# Patient Record
Sex: Female | Born: 1990 | Race: White | Hispanic: No | Marital: Single | Smoking: Former smoker
Health system: Southern US, Community
[De-identification: ages and names within clinical notes are randomized; demographics above are authoritative.]

## PROBLEM LIST (undated history)

## (undated) DIAGNOSIS — O039 Complete or unspecified spontaneous abortion without complication: Secondary | ICD-10-CM

## (undated) DIAGNOSIS — F410 Panic disorder [episodic paroxysmal anxiety] without agoraphobia: Secondary | ICD-10-CM

## (undated) DIAGNOSIS — G43909 Migraine, unspecified, not intractable, without status migrainosus: Secondary | ICD-10-CM

## (undated) DIAGNOSIS — F32A Depression, unspecified: Secondary | ICD-10-CM

## (undated) DIAGNOSIS — F429 Obsessive-compulsive disorder, unspecified: Secondary | ICD-10-CM

## (undated) HISTORY — PX: DILATION AND CURETTAGE OF UTERUS: SHX78

## (undated) HISTORY — PX: INDUCED ABORTION: SHX677

## (undated) HISTORY — DX: Obsessive-compulsive disorder, unspecified: F42.9

## (undated) HISTORY — DX: Depression, unspecified: F32.A

## (undated) HISTORY — PX: APPENDECTOMY: SHX54

---
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
## 2011-06-04 ENCOUNTER — Emergency Department (HOSPITAL_COMMUNITY): Payer: No Typology Code available for payment source

## 2011-06-04 ENCOUNTER — Emergency Department (HOSPITAL_COMMUNITY)
Admission: EM | Admit: 2011-06-04 | Discharge: 2011-06-04 | Disposition: A | Discharge status: Home / Self Care | Payer: No Typology Code available for payment source | Attending: Emergency Medicine | Admitting: Emergency Medicine

## 2011-06-04 ENCOUNTER — Encounter (HOSPITAL_COMMUNITY): Payer: Self-pay | Admitting: *Deleted

## 2011-06-04 DIAGNOSIS — S6290XA Unspecified fracture of unspecified wrist and hand, initial encounter for closed fracture: Secondary | ICD-10-CM

## 2011-06-04 DIAGNOSIS — S62233A Other displaced fracture of base of first metacarpal bone, unspecified hand, initial encounter for closed fracture: Secondary | ICD-10-CM | POA: Insufficient documentation

## 2011-06-04 DIAGNOSIS — M542 Cervicalgia: Secondary | ICD-10-CM | POA: Insufficient documentation

## 2011-06-04 DIAGNOSIS — F17200 Nicotine dependence, unspecified, uncomplicated: Secondary | ICD-10-CM | POA: Insufficient documentation

## 2011-06-04 DIAGNOSIS — M79609 Pain in unspecified limb: Secondary | ICD-10-CM | POA: Insufficient documentation

## 2011-06-04 MED ORDER — SODIUM POLYSTYRENE SULFONATE 15 GM/60ML PO SUSP: 60.0000 g | Freq: Once | ORAL | Status: DC

## 2011-06-04 MED ORDER — HYDROCODONE-ACETAMINOPHEN 5-325 MG PO TABS: 1.0000 | ORAL_TABLET | Freq: Four times a day (QID) | ORAL | Status: AC | PRN
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
## 2011-06-04 NOTE — Discharge Instructions (Signed)
Follow up with dr. Hilda Lias next week for recheck

## 2011-06-04 NOTE — ED Notes (Signed)
Patient returned from  X-ray. Pt tolerated procedure well.
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
## 2011-07-20 ENCOUNTER — Encounter (HOSPITAL_COMMUNITY): Payer: Self-pay | Admitting: *Deleted

## 2011-07-20 ENCOUNTER — Emergency Department (HOSPITAL_COMMUNITY)
Admission: EM | Admit: 2011-07-20 | Discharge: 2011-07-20 | Disposition: A | Discharge status: Home / Self Care | Payer: Self-pay | Attending: Emergency Medicine | Admitting: Emergency Medicine

## 2011-07-20 DIAGNOSIS — F17200 Nicotine dependence, unspecified, uncomplicated: Secondary | ICD-10-CM | POA: Insufficient documentation

## 2011-07-20 DIAGNOSIS — L559 Sunburn, unspecified: Secondary | ICD-10-CM | POA: Insufficient documentation

## 2011-07-20 DIAGNOSIS — L550 Sunburn of first degree: Secondary | ICD-10-CM

## 2011-07-20 MED ORDER — PREDNISONE 20 MG PO TABS
60.0000 mg | ORAL_TABLET | Freq: Once | ORAL | Status: AC
  Administered 2011-07-20: 60 mg via ORAL
  Filled 2011-07-20: qty 3

## 2011-07-20 MED ORDER — HYDROXYZINE PAMOATE 25 MG PO CAPS: ORAL_CAPSULE | ORAL | Status: DC

## 2011-07-20 MED ORDER — HYDROXYZINE HCL 50 MG/ML IM SOLN: 25.0000 mg | Freq: Once | INTRAMUSCULAR | Status: DC

## 2011-07-20 MED ORDER — PREDNISONE 10 MG PO TABS: ORAL_TABLET | ORAL | Status: DC

## 2011-07-20 MED ORDER — HYDROXYZINE HCL 25 MG PO TABS
50.0000 mg | ORAL_TABLET | Freq: Once | ORAL | Status: AC
  Administered 2011-07-20: 50 mg via ORAL
  Filled 2011-07-20: qty 2
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
## 2011-07-20 NOTE — Discharge Instructions (Signed)
Sunburn  Sunburn is damage to the skin caused by overexposure to ultraviolet (UV) rays. People with light skin or a fair complexion may be more susceptible to sunburn. Repeated sun exposure causes early skin aging such as wrinkles and sun spots. It also increases the risk of skin cancer.  CAUSES  A sunburn is caused by getting too much UV radiation from the sun.  SYMPTOMS   Red or pink skin.   Soreness and swelling.   Pain.   Blisters.   Peeling skin.   Headache, fever, and fatigue if sunburn covers a large area.  TREATMENT   Your caregiver may tell you to take certain medicines to lessen inflammation.   Your caregiver may have you use hydrocortisone cream or spray to help with itching and inflammation.   Your caregiver may prescribe an antibiotic cream to use on blisters.  HOME CARE INSTRUCTIONS    Avoid further exposure to the sun.   Cool baths and cool compresses may be helpful if used several times per day. Do not apply ice, since this may result in more damage to the skin.   Only take over-the-counter or prescription medicines for pain, discomfort, or fever as directed by your caregiver.   Use aloe or other over-the-counter sunburn creams or gels on your skin. Do not apply these creams or gels on blisters.   Drink enough fluids to keep your urine clear or pale yellow.   Do not break blisters. If blisters break, your caregiver may recommend an antibiotic cream to apply to the affected area.  PREVENTION    Try to avoid the sun between 10:00 a.m. and 4:00 p.m. when it is the strongest.   Use a sunscreen or sunblock with SPF 30 or greater.   Apply sunscreen at least 30 minutes before exposure to the sun.   Always wear protective hats, clothing, and sunglasses with UV protection.   Avoid medicines, herbs, and foods that increase your sensitivity to sunlight.   Avoid tanning beds.  SEEK IMMEDIATE MEDICAL CARE IF:    You have a fever.   Your pain is uncontrolled with medicine.   You start to  vomit or have diarrhea.   You feel faint or develop a headache with confusion.   You develop severe blistering.   You have a pus-like (purulent) discharge coming from the blisters.   Your burn becomes more painful and swollen.  MAKE SURE YOU:   Understand these instructions.   Will watch your condition.   Will get help right away if you are not doing well or get worse.  Document Released: 10/16/2004 Document Revised: 12/26/2010 Document Reviewed: 06/30/2010  [employer] Patient Information 2012 [employer].
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
## 2011-07-20 NOTE — ED Provider Notes (Signed)
Medical screening examination/treatment/procedure(s) were performed by non-physician practitioner and as supervising physician I was immediately available for consultation/collaboration.   Caron Tardif L Taysha Majewski, MD 07/20/11 2225 
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
## 2011-10-06 ENCOUNTER — Encounter (HOSPITAL_COMMUNITY): Payer: Self-pay | Admitting: *Deleted

## 2011-10-06 ENCOUNTER — Emergency Department (HOSPITAL_COMMUNITY)
Admission: EM | Admit: 2011-10-06 | Discharge: 2011-10-06 | Disposition: A | Discharge status: Home / Self Care | Payer: Self-pay | Attending: Emergency Medicine | Admitting: Emergency Medicine

## 2011-10-06 DIAGNOSIS — F17200 Nicotine dependence, unspecified, uncomplicated: Secondary | ICD-10-CM | POA: Insufficient documentation

## 2011-10-06 DIAGNOSIS — J019 Acute sinusitis, unspecified: Secondary | ICD-10-CM | POA: Insufficient documentation

## 2011-10-06 DIAGNOSIS — R51 Headache: Secondary | ICD-10-CM | POA: Insufficient documentation

## 2011-10-06 HISTORY — DX: Migraine, unspecified, not intractable, without status migrainosus: G43.909

## 2011-10-06 MED ORDER — HYDROCODONE-ACETAMINOPHEN 5-325 MG PO TABS
1.0000 | ORAL_TABLET | Freq: Once | ORAL | Status: AC
  Administered 2011-10-06: 1 via ORAL
  Filled 2011-10-06: qty 1

## 2011-10-06 MED ORDER — AMOXICILLIN 500 MG PO CAPS: 500.0000 mg | ORAL_CAPSULE | Freq: Three times a day (TID) | ORAL | Status: DC

## 2011-10-06 MED ORDER — HYDROCODONE-ACETAMINOPHEN 5-325 MG PO TABS: 1.0000 | ORAL_TABLET | Freq: Four times a day (QID) | ORAL | Status: AC | PRN

## 2011-10-06 MED ORDER — AMOXICILLIN 250 MG PO CAPS
500.0000 mg | ORAL_CAPSULE | Freq: Once | ORAL | Status: AC
  Administered 2011-10-06: 500 mg via ORAL
  Filled 2011-10-06: qty 2

## 2011-10-06 NOTE — ED Notes (Addendum)
Headache, vomiting Hurts behind rt eye.  Was supposed to get her depo shot and missed the appt. LMP lasted 1.5 day.

## 2011-10-06 NOTE — ED Provider Notes (Signed)
History     CSN: [id number]  Arrival date & time 10/06/11  1820   First MD Initiated Contact with Patient 10/06/11 1946      Chief Complaint  Patient presents with  . Headache    (Consider location/radiation/quality/duration/timing/severity/associated sxs/prior treatment) HPI Comments: Pain over R frontal sinus.  Nausea and vomiting x 3 today.  "Fever of 100 . Something".  The history is provided by the patient. No language interpreter was used.    Past Medical History  Diagnosis Date  . Migraine     Past Surgical History  Procedure Date  . Induced abortion     History reviewed. No pertinent family history.  History  Substance Use Topics  . Smoking status: Current Every Day Smoker    Types: Cigarettes  . Smokeless tobacco: Not on file  . Alcohol Use: No    OB History    Grav Para Term Preterm Abortions TAB SAB Ect Mult Living                  Review of Systems  Constitutional: Positive for fever. Negative for chills.  HENT: Positive for sinus pressure.   Gastrointestinal: Positive for nausea and vomiting.  Neurological: Positive for headaches.  All other systems reviewed and are negative.    Allergies  Review of patient's allergies indicates no known allergies.  Home Medications   Current Outpatient Rx  Name Route Sig Dispense Refill  . AMOXICILLIN 500 MG PO CAPS Oral Take 1 capsule (500 mg total) by mouth 3 (three) times daily. 21 capsule 0  . HYDROCODONE-ACETAMINOPHEN 5-325 MG PO TABS Oral Take 1 tablet by mouth every 6 (six) hours as needed for pain. 12 tablet 0  . HYDROXYZINE PAMOATE 25 MG PO CAPS  1 po q6h for burning and discomfort. 20 capsule 0  . PREDNISONE 10 MG PO TABS  5,4,3,2,1 - take with food 15 tablet 0    BP 113/71  Pulse 92  Temp 98.3 F (36.8 C) (Oral)  Resp 20  Ht 5' (1.524 m)  Wt 115 lb (52.164 kg)  BMI 22.46 kg/m2  SpO2 100%  LMP 09/24/2011  Physical Exam  Nursing note and vitals reviewed. Constitutional: She is  oriented to person, place, and time. She appears well-developed and well-nourished. No distress.  HENT:  Head: Normocephalic and atraumatic.    Eyes: EOM are normal.  Neck: Normal range of motion.  Cardiovascular: Normal rate, regular rhythm and normal heart sounds.   Pulmonary/Chest: Effort normal and breath sounds normal.  Abdominal: Soft. She exhibits no distension. There is no tenderness.  Musculoskeletal: Normal range of motion.  Lymphadenopathy:    She has no cervical adenopathy.  Neurological: She is alert and oriented to person, place, and time.  Skin: Skin is warm and dry.  Psychiatric: She has a normal mood and affect. Judgment normal.    ED Course  Procedures (including critical care time)  Labs Reviewed - No data to display No results found.   1. Sinusitis acute   2. Sinus headache       MDM  rx-amoxicillin 500 mg TID, 21 rx-hydrocodone, 21  F/u [facility] prn        Evalina Field, [place] 10/06/11 2022

## 2011-10-06 NOTE — ED Provider Notes (Signed)
Medical screening examination/treatment/procedure(s) were performed by non-physician practitioner and as supervising physician I was immediately available for consultation/collaboration.  Juliet Rude. Rubin Payor, MD 10/06/11 [phone]

## 2012-02-29 ENCOUNTER — Encounter (HOSPITAL_COMMUNITY): Payer: Self-pay

## 2012-02-29 ENCOUNTER — Emergency Department (HOSPITAL_COMMUNITY): Payer: Medicaid Other

## 2012-02-29 ENCOUNTER — Emergency Department (HOSPITAL_COMMUNITY)
Admission: EM | Admit: 2012-02-29 | Discharge: 2012-02-29 | Disposition: A | Discharge status: Home / Self Care | Payer: Medicaid Other | Attending: Emergency Medicine | Admitting: Emergency Medicine

## 2012-02-29 DIAGNOSIS — S0993XA Unspecified injury of face, initial encounter: Secondary | ICD-10-CM | POA: Insufficient documentation

## 2012-02-29 DIAGNOSIS — R42 Dizziness and giddiness: Secondary | ICD-10-CM | POA: Insufficient documentation

## 2012-02-29 DIAGNOSIS — R1110 Vomiting, unspecified: Secondary | ICD-10-CM | POA: Insufficient documentation

## 2012-02-29 DIAGNOSIS — Z8679 Personal history of other diseases of the circulatory system: Secondary | ICD-10-CM | POA: Insufficient documentation

## 2012-02-29 DIAGNOSIS — F17200 Nicotine dependence, unspecified, uncomplicated: Secondary | ICD-10-CM | POA: Insufficient documentation

## 2012-02-29 DIAGNOSIS — S0990XA Unspecified injury of head, initial encounter: Secondary | ICD-10-CM

## 2012-02-29 DIAGNOSIS — H539 Unspecified visual disturbance: Secondary | ICD-10-CM | POA: Insufficient documentation

## 2012-02-29 MED ORDER — PROMETHAZINE HCL 25 MG PO TABS: 25.0000 mg | ORAL_TABLET | Freq: Four times a day (QID) | ORAL | Status: DC | PRN

## 2012-02-29 MED ORDER — HYDROCODONE-ACETAMINOPHEN 5-325 MG PO TABS: 1.0000 | ORAL_TABLET | Freq: Four times a day (QID) | ORAL | Status: DC | PRN
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
## 2012-03-25 ENCOUNTER — Emergency Department (HOSPITAL_COMMUNITY)
Admission: EM | Admit: 2012-03-25 | Discharge: 2012-03-25 | Disposition: A | Discharge status: Home / Self Care | Payer: Self-pay | Attending: Emergency Medicine | Admitting: Emergency Medicine

## 2012-03-25 ENCOUNTER — Encounter (HOSPITAL_COMMUNITY): Payer: Self-pay | Admitting: Emergency Medicine

## 2012-03-25 DIAGNOSIS — R109 Unspecified abdominal pain: Secondary | ICD-10-CM | POA: Insufficient documentation

## 2012-03-25 DIAGNOSIS — O9989 Other specified diseases and conditions complicating pregnancy, childbirth and the puerperium: Secondary | ICD-10-CM | POA: Insufficient documentation

## 2012-03-25 DIAGNOSIS — O99330 Smoking (tobacco) complicating pregnancy, unspecified trimester: Secondary | ICD-10-CM | POA: Insufficient documentation

## 2012-03-25 DIAGNOSIS — O26899 Other specified pregnancy related conditions, unspecified trimester: Secondary | ICD-10-CM

## 2012-03-25 DIAGNOSIS — Z8679 Personal history of other diseases of the circulatory system: Secondary | ICD-10-CM | POA: Insufficient documentation

## 2012-03-25 LAB — URINALYSIS, ROUTINE W REFLEX MICROSCOPIC
Leukocytes, UA: NEGATIVE
Nitrite: NEGATIVE
Specific Gravity, Urine: 1.015 (ref 1.005–1.030)
Urobilinogen, UA: 0.2 mg/dL (ref 0.0–1.0)
pH: 6 (ref 5.0–8.0)

## 2012-03-25 LAB — PREGNANCY, URINE: Preg Test, Ur: POSITIVE — AB

## 2012-03-25 LAB — WET PREP, GENITAL
Trich, Wet Prep: NONE SEEN
Yeast Wet Prep HPF POC: NONE SEEN

## 2012-03-25 NOTE — ED Provider Notes (Signed)
History     CSN: [id number]  Arrival date & time 03/25/12  1319   First MD Initiated Contact with Patient 03/25/12 1700      Chief Complaint  Patient presents with  . Abdominal Pain    (Consider location/radiation/quality/duration/timing/severity/associated sxs/prior treatment) Patient is a 22 y.o. female presenting with abdominal pain.  Abdominal Pain  Pt reports she is approx [redacted]weeks pregnant [facility] She has also had pelvic exam and STD screening done there recently and told she has a bacterial infection which they are monitoring but not treating. The patient reports today she had moderate sharp lower abdominal pain, but no vaginal discharge or bleeding.   Past Medical History  Diagnosis Date  . Migraine     Past Surgical History  Procedure Laterality Date  . Induced abortion      Family History  Problem Relation Age of Onset  . Hypertension Mother   . Cancer Other   . Hypertension Other     History  Substance Use Topics  . Smoking status: Current Every Day Smoker    Types: Cigarettes  . Smokeless tobacco: Not on file  . Alcohol Use: No    OB History   Grav Para Term Preterm Abortions TAB SAB Ect Mult Living   1               Review of Systems  Gastrointestinal: Positive for abdominal pain.   All other systems reviewed and are negative except as noted in HPI.   Allergies  Erythromycin  Home Medications   Current Outpatient Rx  Name  Route  Sig  Dispense  Refill  . HYDROcodone-acetaminophen (NORCO/VICODIN) 5-325 MG per tablet   Oral   Take 1 tablet by mouth every 6 (six) hours as needed for pain.   15 tablet   0   . promethazine (PHENERGAN) 25 MG tablet   Oral   Take 1 tablet (25 mg total) by mouth every 6 (six) hours as needed for nausea.   15 tablet   0     BP 106/63  Pulse 83  Temp(Src) 97.9 F (36.6 C) (Oral)  Resp 18  Ht 5' (1.524 m)  Wt 120 lb (54.432 kg)  BMI 23.44 kg/m2  SpO2 100%   LMP 02/21/2012  Physical Exam  Nursing note and vitals reviewed. Constitutional: She is oriented to person, place, and time. She appears well-developed and well-nourished.  HENT:  Head: Normocephalic and atraumatic.  Eyes: EOM are normal. Pupils are equal, round, and reactive to light.  Neck: Normal range of motion. Neck supple.  Cardiovascular: Normal rate, normal heart sounds and intact distal pulses.   Pulmonary/Chest: Effort normal and breath sounds normal.  Abdominal: Bowel sounds are normal. She exhibits no distension. There is no tenderness.  Genitourinary: Cervix exhibits no motion tenderness, no discharge and no friability. Right adnexum displays no mass and no tenderness. Left adnexum displays no mass. No bleeding around the vagina. No foreign body around the vagina. Vaginal discharge found.  Os closed, no POC  Musculoskeletal: Normal range of motion. She exhibits no edema and no tenderness.  Neurological: She is alert and oriented to person, place, and time. She has normal strength. No cranial nerve deficit or sensory deficit.  Skin: Skin is warm and dry. No rash noted.  Psychiatric: She has a normal mood and affect.    ED Course  Procedures (including critical care time)  Labs Reviewed  WET PREP, GENITAL -  Abnormal; Notable for the following:    Clue Cells Wet Prep HPF POC FEW (*)    WBC, Wet Prep HPF POC FEW (*)    All other components within normal limits  PREGNANCY, URINE - Abnormal; Notable for the following:    Preg Test, Ur POSITIVE (*)    All other components within normal limits  GC/CHLAMYDIA PROBE AMP  URINALYSIS, ROUTINE W REFLEX MICROSCOPIC   No results found.   No diagnosis found.    MDM  Pt requesting pelvic exam to 'make sure the cervix is closed'. Will check UA, wet prep, GC/C, but otherwise she has already had thorough evaluation for early pregnancy and does not need any further ED lab studies.   UA neg, wet prep unimpressive. Advised  [facility].       Charles B. Bernette Mayers, MD 03/25/12 1800

## 2012-03-25 NOTE — ED Notes (Signed)
Pt c/o low back, groin and abd pain, onset this am. Pt [redacted] weeks pregnant.

## 2012-05-11 ENCOUNTER — Emergency Department (HOSPITAL_COMMUNITY): Payer: Medicaid Other

## 2012-05-11 ENCOUNTER — Emergency Department (HOSPITAL_COMMUNITY)
Admission: EM | Admit: 2012-05-11 | Discharge: 2012-05-12 | Disposition: A | Discharge status: Home / Self Care | Payer: Medicaid Other | Attending: Emergency Medicine | Admitting: Emergency Medicine

## 2012-05-11 ENCOUNTER — Encounter (HOSPITAL_COMMUNITY): Payer: Self-pay | Admitting: *Deleted

## 2012-05-11 DIAGNOSIS — R091 Pleurisy: Secondary | ICD-10-CM | POA: Insufficient documentation

## 2012-05-11 DIAGNOSIS — R0789 Other chest pain: Secondary | ICD-10-CM

## 2012-05-11 DIAGNOSIS — R071 Chest pain on breathing: Secondary | ICD-10-CM | POA: Insufficient documentation

## 2012-05-11 DIAGNOSIS — Z8679 Personal history of other diseases of the circulatory system: Secondary | ICD-10-CM | POA: Insufficient documentation

## 2012-05-11 DIAGNOSIS — F17200 Nicotine dependence, unspecified, uncomplicated: Secondary | ICD-10-CM | POA: Insufficient documentation

## 2012-05-11 DIAGNOSIS — Z3201 Encounter for pregnancy test, result positive: Secondary | ICD-10-CM | POA: Insufficient documentation

## 2012-05-11 MED ORDER — IBUPROFEN 400 MG PO TABS
600.0000 mg | ORAL_TABLET | Freq: Once | ORAL | Status: AC
  Administered 2012-05-12: 600 mg via ORAL
  Filled 2012-05-11: qty 2
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
## 2012-05-11 NOTE — ED Notes (Signed)
Pt with left sided chest with movement and cough, family hx of cardiac problems, NP cough, smoker, denies SOB

## 2012-05-11 NOTE — ED Notes (Signed)
Xray called and needed urine preg prior to xray. Order placed and urine positive. Pt reports having D&C done on April 9th for miscarriage. Dr. Judd Lien aware of positive test.

## 2012-05-12 LAB — POCT PREGNANCY, URINE: Preg Test, Ur: POSITIVE — AB

## 2012-05-12 LAB — HCG, QUANTITATIVE, PREGNANCY: hCG, Beta Chain, Quant, S: 2 m[IU]/mL (ref <5)

## 2012-05-12 MED ORDER — IBUPROFEN 600 MG PO TABS: 600.0000 mg | ORAL_TABLET | Freq: Four times a day (QID) | ORAL | Status: DC | PRN

## 2012-06-01 ENCOUNTER — Encounter (HOSPITAL_COMMUNITY): Payer: Self-pay | Admitting: Emergency Medicine

## 2012-06-01 ENCOUNTER — Emergency Department (HOSPITAL_COMMUNITY)
Admission: EM | Admit: 2012-06-01 | Discharge: 2012-06-01 | Disposition: A | Discharge status: Home / Self Care | Payer: Medicaid Other | Attending: Emergency Medicine | Admitting: Emergency Medicine

## 2012-06-01 DIAGNOSIS — F17200 Nicotine dependence, unspecified, uncomplicated: Secondary | ICD-10-CM | POA: Insufficient documentation

## 2012-06-01 DIAGNOSIS — R059 Cough, unspecified: Secondary | ICD-10-CM | POA: Insufficient documentation

## 2012-06-01 DIAGNOSIS — J029 Acute pharyngitis, unspecified: Secondary | ICD-10-CM

## 2012-06-01 DIAGNOSIS — R05 Cough: Secondary | ICD-10-CM | POA: Insufficient documentation

## 2012-06-01 DIAGNOSIS — R110 Nausea: Secondary | ICD-10-CM

## 2012-06-01 DIAGNOSIS — G8929 Other chronic pain: Secondary | ICD-10-CM

## 2012-06-01 DIAGNOSIS — J3489 Other specified disorders of nose and nasal sinuses: Secondary | ICD-10-CM | POA: Insufficient documentation

## 2012-06-01 DIAGNOSIS — M79609 Pain in unspecified limb: Secondary | ICD-10-CM | POA: Insufficient documentation

## 2012-06-01 DIAGNOSIS — Z8659 Personal history of other mental and behavioral disorders: Secondary | ICD-10-CM | POA: Insufficient documentation

## 2012-06-01 DIAGNOSIS — Z8679 Personal history of other diseases of the circulatory system: Secondary | ICD-10-CM | POA: Insufficient documentation

## 2012-06-01 DIAGNOSIS — R112 Nausea with vomiting, unspecified: Secondary | ICD-10-CM | POA: Insufficient documentation

## 2012-06-01 HISTORY — DX: Panic disorder (episodic paroxysmal anxiety): F41.0

## 2012-06-01 HISTORY — DX: Complete or unspecified spontaneous abortion without complication: O03.9

## 2012-06-01 LAB — URINE MICROSCOPIC-ADD ON

## 2012-06-01 LAB — URINALYSIS, ROUTINE W REFLEX MICROSCOPIC
Bilirubin Urine: NEGATIVE
Glucose, UA: NEGATIVE mg/dL
Hgb urine dipstick: NEGATIVE
Specific Gravity, Urine: 1.015 (ref 1.005–1.030)
Urobilinogen, UA: 0.2 mg/dL (ref 0.0–1.0)

## 2012-06-01 MED ORDER — HYDROCODONE-ACETAMINOPHEN 5-325 MG PO TABS: ORAL_TABLET | ORAL | Status: DC

## 2012-06-01 MED ORDER — ONDANSETRON HCL 4 MG PO TABS: 4.0000 mg | ORAL_TABLET | Freq: Four times a day (QID) | ORAL | Status: DC

## 2012-06-01 MED ORDER — ONDANSETRON 8 MG PO TBDP
8.0000 mg | ORAL_TABLET | Freq: Once | ORAL | Status: AC
  Administered 2012-06-01: 8 mg via ORAL
  Filled 2012-06-01: qty 1

## 2012-06-01 NOTE — ED Notes (Signed)
Patient c/o itermittent nausea x2 weeks with vomiting last night. Denies any diarrhea or abd pain. Patient also reports sore throat and dry cough as well. Denies any fevers. Patient also c/o right hand pain and swelling. Per patient broke hand 2 months ago and was unable to follow-up with specialist.

## 2012-06-01 NOTE — ED Provider Notes (Signed)
History     CSN: [id number]  Arrival date & time 06/01/12  0903   First MD Initiated Contact with Patient 06/01/12 [phone]      Chief Complaint  Patient presents with  . Hand Pain  . Emesis  . Sore Throat    (Consider location/radiation/quality/duration/timing/severity/associated sxs/prior treatment) HPI Comments: Patient c/o nausea for 2 weeks with vomiting last night.  States that she is tolerating fluids and denies abdominal pain.  She also c/o sore throat and occasional cough that is non-productive.  She denies fever.  She states that she had a D&C one month ago and is unsure if she may be pregnant again.  States that a home pregnancy test was positive.  She denies vaginal bleeding or discharge.    Patient also c/o pain and swelling to her right thumb for 2 months.  States that she had a fracture to her thumb  "a while back" and did not f/u with an orthopedic doctor .  C/o pain to the area since.    Patient is a 22 y.o. female presenting with vomiting. The history is provided by the patient.  Emesis Severity:  Mild Duration:  2 weeks Timing:  Intermittent Quality:  Stomach contents Able to tolerate:  Liquids Progression:  Unchanged Chronicity:  Recurrent Recent urination:  Normal Relieved by:  Nothing Worsened by:  Nothing tried Ineffective treatments:  None tried Associated symptoms: sore throat   Associated symptoms: no abdominal pain, no chills, no cough, no diarrhea, no fever, no headaches, no myalgias and no URI   Sore throat:    Severity:  Mild   Onset quality:  Gradual   Duration:  2 days   Timing:  Constant   Progression:  Unchanged Risk factors: no diabetes, not pregnant now and no sick contacts     Past Medical History  Diagnosis Date  . Migraine   . Miscarriage   . Panic attack     Past Surgical History  Procedure Laterality Date  . Induced abortion    . Dilation and curettage of uterus      Family History  Problem Relation Age of Onset  .  Hypertension Mother   . Cancer Other   . Hypertension Other     History  Substance Use Topics  . Smoking status: Current Every Day Smoker -- 1.00 packs/day for 7 years    Types: Cigarettes  . Smokeless tobacco: Never Used  . Alcohol Use: No    OB History   Grav Para Term Preterm Abortions TAB SAB Ect Mult Living   1               Review of Systems  Constitutional: Negative for fever, chills, activity change and appetite change.  HENT: Positive for congestion and sore throat. Negative for trouble swallowing.   Respiratory: Positive for cough. Negative for chest tightness, shortness of breath and wheezing.   Cardiovascular: Negative for chest pain.  Gastrointestinal: Positive for nausea and vomiting. Negative for abdominal pain, diarrhea and abdominal distention.  Genitourinary: Negative for dysuria, frequency, vaginal bleeding, vaginal discharge and menstrual problem.  Musculoskeletal: Negative for myalgias.  Neurological: Negative for dizziness and headaches.  All other systems reviewed and are negative.    Allergies  Erythromycin  Home Medications   Current Outpatient Rx  Name  Route  Sig  Dispense  Refill  . HYDROcodone-acetaminophen (NORCO/VICODIN) 5-325 MG per tablet      Take one-two tabs po q 4-6 hrs prn pain   12  tablet   0   . ondansetron (ZOFRAN) 4 MG tablet   Oral   Take 1 tablet (4 mg total) by mouth every 6 (six) hours.   12 tablet   0     BP 105/70  Pulse 80  Temp(Src) 98 F (36.7 C) (Oral)  Resp 16  Ht 5' (1.524 m)  Wt 120 lb (54.432 kg)  BMI 23.44 kg/m2  SpO2 99%  LMP 02/21/2012  Physical Exam  Nursing note and vitals reviewed. Constitutional: She is oriented to person, place, and time. She appears well-developed and well-nourished. No distress.  HENT:  Head: Normocephalic and atraumatic.  Right Ear: Tympanic membrane and ear canal normal.  Left Ear: Tympanic membrane and ear canal normal.  Mouth/Throat: Uvula is midline and mucous  membranes are normal. Posterior oropharyngeal erythema present. No oropharyngeal exudate, posterior oropharyngeal edema or tonsillar abscesses.  Neck: Normal range of motion. Neck supple.  Cardiovascular: Normal rate, regular rhythm and normal heart sounds.   No murmur heard. Pulmonary/Chest: Effort normal and breath sounds normal. No respiratory distress. She exhibits no tenderness.  Abdominal: Soft. Bowel sounds are normal. She exhibits no distension and no mass. There is no tenderness. There is no rebound and no guarding.  Musculoskeletal: Normal range of motion. She exhibits tenderness.  ttp of the right proximal thumb, mild STS.  Radial pulse brisk. Distal sensation intact. Pt has full ROM of the thumb.  Lymphadenopathy:    She has no cervical adenopathy.  Neurological: She is alert and oriented to person, place, and time. She exhibits normal muscle tone. Coordination normal.  Skin: Skin is dry.    ED Course  Procedures (including critical care time)  Results for orders placed during the hospital encounter of 06/01/12  HCG, QUANTITATIVE, PREGNANCY      Result Value Range   hCG, Beta Chain, Quant, S <1  <5 mIU/mL  URINALYSIS, ROUTINE W REFLEX MICROSCOPIC      Result Value Range   Color, Urine YELLOW  YELLOW   APPearance CLEAR  CLEAR   Specific Gravity, Urine 1.015  1.005 - 1.030   pH 6.0  5.0 - 8.0   Glucose, UA NEGATIVE  NEGATIVE mg/dL   Hgb urine dipstick NEGATIVE  NEGATIVE   Bilirubin Urine NEGATIVE  NEGATIVE   Ketones, ur NEGATIVE  NEGATIVE mg/dL   Protein, ur NEGATIVE  NEGATIVE mg/dL   Urobilinogen, UA 0.2  0.0 - 1.0 mg/dL   Nitrite NEGATIVE  NEGATIVE   Leukocytes, UA TRACE (*) NEGATIVE  URINE MICROSCOPIC-ADD ON      Result Value Range   Squamous Epithelial / LPF FEW (*) RARE   WBC, UA 0-2  <3 WBC/hpf   RBC / HPF 0-2  <3 RBC/hpf   Bacteria, UA RARE  RARE      1. Chronic thumb pain, right   2. Nausea   3. Pharyngitis     velcro thumb spica applied.  Pain  improved, remains NV intact  MDM    U/A negative, beta HCG was 0.506.  Pt is well appearing, abd is NT.  VSS.  Patient agrees to clear fluids, rest and close f/u with her PMD .  Given referral for orthopedics.    The patient appears reasonably screened and/or stabilized for discharge and I doubt any other medical condition or other [facility] requiring further screening, evaluation, or treatment in the ED at this time prior to discharge.       Sriram Febles L. Trisha Mangle, PA-C 06/02/12 1942

## 2012-06-01 NOTE — ED Notes (Signed)
Tammy Triplett at bedside. 

## 2012-06-04 NOTE — ED Provider Notes (Signed)
Medical screening examination/treatment/procedure(s) were performed by non-physician practitioner and as supervising physician I was immediately available for consultation/collaboration. Devoria Albe, MD, FACEP   Ward Givens, MD 06/04/12 [phone]

## 2012-07-24 ENCOUNTER — Encounter (HOSPITAL_COMMUNITY): Payer: Self-pay | Admitting: Emergency Medicine

## 2012-07-24 ENCOUNTER — Emergency Department (HOSPITAL_COMMUNITY)
Admission: EM | Admit: 2012-07-24 | Discharge: 2012-07-25 | Disposition: A | Discharge status: Home / Self Care | Payer: Medicaid Other | Attending: Emergency Medicine | Admitting: Emergency Medicine

## 2012-07-24 DIAGNOSIS — Z8679 Personal history of other diseases of the circulatory system: Secondary | ICD-10-CM | POA: Insufficient documentation

## 2012-07-24 DIAGNOSIS — W230XXA Caught, crushed, jammed, or pinched between moving objects, initial encounter: Secondary | ICD-10-CM | POA: Insufficient documentation

## 2012-07-24 DIAGNOSIS — Y939 Activity, unspecified: Secondary | ICD-10-CM | POA: Insufficient documentation

## 2012-07-24 DIAGNOSIS — Z8659 Personal history of other mental and behavioral disorders: Secondary | ICD-10-CM | POA: Insufficient documentation

## 2012-07-24 DIAGNOSIS — S6000XA Contusion of unspecified finger without damage to nail, initial encounter: Secondary | ICD-10-CM | POA: Insufficient documentation

## 2012-07-24 DIAGNOSIS — Y929 Unspecified place or not applicable: Secondary | ICD-10-CM | POA: Insufficient documentation

## 2012-07-24 DIAGNOSIS — Z8781 Personal history of (healed) traumatic fracture: Secondary | ICD-10-CM | POA: Insufficient documentation

## 2012-07-24 DIAGNOSIS — M79641 Pain in right hand: Secondary | ICD-10-CM

## 2012-07-24 DIAGNOSIS — Z8742 Personal history of other diseases of the female genital tract: Secondary | ICD-10-CM | POA: Insufficient documentation

## 2012-07-24 DIAGNOSIS — F17200 Nicotine dependence, unspecified, uncomplicated: Secondary | ICD-10-CM | POA: Insufficient documentation
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
## 2012-07-25 ENCOUNTER — Emergency Department (HOSPITAL_COMMUNITY): Payer: Medicaid Other
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
## 2012-12-01 ENCOUNTER — Encounter (HOSPITAL_COMMUNITY): Payer: Self-pay | Admitting: Emergency Medicine

## 2012-12-01 ENCOUNTER — Emergency Department (HOSPITAL_COMMUNITY)
Admission: EM | Admit: 2012-12-01 | Discharge: 2012-12-01 | Disposition: A | Discharge status: Home / Self Care | Payer: Medicaid Other | Attending: Emergency Medicine | Admitting: Emergency Medicine

## 2012-12-01 DIAGNOSIS — Z8659 Personal history of other mental and behavioral disorders: Secondary | ICD-10-CM | POA: Insufficient documentation

## 2012-12-01 DIAGNOSIS — F17200 Nicotine dependence, unspecified, uncomplicated: Secondary | ICD-10-CM | POA: Insufficient documentation

## 2012-12-01 DIAGNOSIS — Z8742 Personal history of other diseases of the female genital tract: Secondary | ICD-10-CM | POA: Insufficient documentation

## 2012-12-01 DIAGNOSIS — B86 Scabies: Secondary | ICD-10-CM | POA: Insufficient documentation

## 2012-12-01 DIAGNOSIS — Z8679 Personal history of other diseases of the circulatory system: Secondary | ICD-10-CM | POA: Insufficient documentation

## 2012-12-01 MED ORDER — PERMETHRIN 5 % EX CREA: TOPICAL_CREAM | CUTANEOUS | Status: DC

## 2012-12-01 MED ORDER — HYDROXYZINE HCL 25 MG PO TABS: 25.0000 mg | ORAL_TABLET | Freq: Four times a day (QID) | ORAL | Status: DC
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
## 2012-12-03 NOTE — ED Provider Notes (Signed)
CSN: [id number]     Arrival date & time 12/01/12  2037 History   First MD Initiated Contact with Patient 12/01/12 2049     Chief Complaint  Patient presents with  . Rash   (Consider location/radiation/quality/duration/timing/severity/associated sxs/prior Treatment) Patient is a 22 y.o. female presenting with rash. The history is provided by the patient.  Rash Location:  Full body Quality: itchiness and redness   Quality: not blistering, not dry and not swelling   Severity:  Moderate Onset quality:  Gradual Duration:  4 weeks Timing:  Constant Progression:  Worsening Chronicity:  New Context: exposure to similar rash   Relieved by:  Nothing Worsened by:  Nothing tried Ineffective treatments: Anti-H. cream and Bactrim. Associated symptoms: no abdominal pain, no fever, no headaches, no induration, no joint pain, no myalgias, no nausea, no shortness of breath, no sore throat, no throat swelling, no tongue swelling, no URI, not vomiting and not wheezing    Patient reports persistent rash to most of her body for 4 weeks. She states the rash began after exposure to her significant other who also has a similar rash. She states that she was seen at another facility and treated with Bactrim without relief. She complains of significant itching all over. She denies fever, chills, vomiting, sore throat or neck pain. Past Medical History  Diagnosis Date  . Migraine   . Miscarriage   . Panic attack    Past Surgical History  Procedure Laterality Date  . Induced abortion    . Dilation and curettage of uterus     Family History  Problem Relation Age of Onset  . Hypertension Mother   . Cancer Other   . Hypertension Other    History  Substance Use Topics  . Smoking status: Current Every Day Smoker -- 1.00 packs/day for 7 years    Types: Cigarettes  . Smokeless tobacco: Never Used  . Alcohol Use: Yes     Comment: occasionally   OB History   Grav Para Term Preterm Abortions TAB SAB Ect  Mult Living   1              Review of Systems  Constitutional: Negative for fever, chills, activity change and appetite change.  HENT: Negative for facial swelling, sore throat and trouble swallowing.   Respiratory: Negative for chest tightness, shortness of breath and wheezing.   Gastrointestinal: Negative for nausea, vomiting and abdominal pain.  Musculoskeletal: Negative for arthralgias, myalgias, neck pain and neck stiffness.  Skin: Positive for rash. Negative for wound.  Neurological: Negative for dizziness, weakness, numbness and headaches.  All other systems reviewed and are negative.    Allergies  Erythromycin  Home Medications   Current Outpatient Rx  Name  Route  Sig  Dispense  Refill  . aspirin 325 MG tablet   Oral   Take 650 mg by mouth 2 (two) times daily as needed for mild pain, moderate pain or headache.         . hydrOXYzine (ATARAX/VISTARIL) 25 MG tablet   Oral   Take 1 tablet (25 mg total) by mouth every 6 (six) hours. As needed for itching.  May cause drowsiness   12 tablet   0   . permethrin (ELIMITE) 5 % cream      Apply to the entire body, leave on for 8-10 hrs then wash off.  May re-apply in one week   60 g   0    BP 120/53  Pulse 75  Temp(Src)  98.3 F (36.8 C) (Oral)  Resp 24  Ht 5' (1.524 m)  Wt 120 lb (54.432 kg)  BMI 23.44 kg/m2  SpO2 100%  LMP 11/28/2012 Physical Exam  Nursing note and vitals reviewed. Constitutional: She is oriented to person, place, and time. She appears well-developed and well-nourished. No distress.  HENT:  Head: Normocephalic and atraumatic.  Mouth/Throat: Oropharynx is clear and moist.  Neck: Normal range of motion. Neck supple.  Cardiovascular: Normal rate, regular rhythm, normal heart sounds and intact distal pulses.   No murmur heard. Pulmonary/Chest: Effort normal and breath sounds normal. No respiratory distress.  Musculoskeletal: She exhibits no edema and no tenderness.  Lymphadenopathy:    She  has no cervical adenopathy.  Neurological: She is alert and oriented to person, place, and time. She exhibits normal muscle tone. Coordination normal.  Skin: Skin is warm. Rash noted. There is erythema.  Diffuse, multiple, small erythematous papules to most of the body including with spaces of the fingers, trunk, and inguinal area.  No edema, pustules, or vesicles.  Multiple areas of excoriation also present.    ED Course  Procedures (including critical care time) Labs Review Labs Reviewed - No data to display Imaging Review No results found.  EKG Interpretation   None       MDM   1. Scabies    Rash appears consistent with scabies. I will treat with permethrin cream and hydroxyzine for itching. Have advised patient to followup with dermatology and given a referral for Dr. Margo Aye if the symptoms are not improving. Patient is well-appearing, vital signs are stable. She appears stable for discharge.    Carlton Sweaney L. Trisha Mangle, PA-C 12/03/12 0117

## 2012-12-03 NOTE — ED Provider Notes (Signed)
Medical screening examination/treatment/procedure(s) were performed by non-physician practitioner and as supervising physician I was immediately available for consultation/collaboration.  EKG Interpretation   None         Eleny Cortez, MD 12/03/12 2119 

## 2012-12-22 ENCOUNTER — Emergency Department (HOSPITAL_COMMUNITY)
Admission: EM | Admit: 2012-12-22 | Discharge: 2012-12-22 | Disposition: A | Discharge status: Home / Self Care | Payer: Medicaid Other | Attending: Emergency Medicine | Admitting: Emergency Medicine

## 2012-12-22 ENCOUNTER — Encounter (HOSPITAL_COMMUNITY): Payer: Self-pay | Admitting: Emergency Medicine

## 2012-12-22 DIAGNOSIS — Z8679 Personal history of other diseases of the circulatory system: Secondary | ICD-10-CM | POA: Insufficient documentation

## 2012-12-22 DIAGNOSIS — B86 Scabies: Secondary | ICD-10-CM | POA: Insufficient documentation

## 2012-12-22 DIAGNOSIS — F410 Panic disorder [episodic paroxysmal anxiety] without agoraphobia: Secondary | ICD-10-CM | POA: Insufficient documentation

## 2012-12-22 DIAGNOSIS — F17200 Nicotine dependence, unspecified, uncomplicated: Secondary | ICD-10-CM | POA: Insufficient documentation

## 2012-12-22 DIAGNOSIS — Z3202 Encounter for pregnancy test, result negative: Secondary | ICD-10-CM | POA: Insufficient documentation

## 2012-12-22 LAB — POCT PREGNANCY, URINE: Preg Test, Ur: NEGATIVE

## 2012-12-22 MED ORDER — PERMETHRIN 5 % EX CREA: TOPICAL_CREAM | CUTANEOUS | Status: DC

## 2012-12-22 NOTE — ED Notes (Signed)
Pt states she's had rash throughout entire body for over a month. Pt states she was treated with "a cream" but denies any relief of symptoms.
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
## 2013-04-22 ENCOUNTER — Encounter (HOSPITAL_COMMUNITY): Payer: Self-pay | Admitting: Emergency Medicine

## 2013-04-22 ENCOUNTER — Emergency Department (HOSPITAL_COMMUNITY)
Admission: EM | Admit: 2013-04-22 | Discharge: 2013-04-22 | Disposition: A | Discharge status: Home / Self Care | Payer: Medicaid Other | Attending: Emergency Medicine | Admitting: Emergency Medicine

## 2013-04-22 DIAGNOSIS — S3981XA Other specified injuries of abdomen, initial encounter: Secondary | ICD-10-CM | POA: Insufficient documentation

## 2013-04-22 DIAGNOSIS — J029 Acute pharyngitis, unspecified: Secondary | ICD-10-CM | POA: Insufficient documentation

## 2013-04-22 DIAGNOSIS — Z79899 Other long term (current) drug therapy: Secondary | ICD-10-CM | POA: Insufficient documentation

## 2013-04-22 DIAGNOSIS — Z8679 Personal history of other diseases of the circulatory system: Secondary | ICD-10-CM | POA: Insufficient documentation

## 2013-04-22 DIAGNOSIS — O9989 Other specified diseases and conditions complicating pregnancy, childbirth and the puerperium: Secondary | ICD-10-CM | POA: Insufficient documentation

## 2013-04-22 DIAGNOSIS — R1030 Lower abdominal pain, unspecified: Secondary | ICD-10-CM

## 2013-04-22 DIAGNOSIS — Z8659 Personal history of other mental and behavioral disorders: Secondary | ICD-10-CM | POA: Insufficient documentation

## 2013-04-22 DIAGNOSIS — O26899 Other specified pregnancy related conditions, unspecified trimester: Secondary | ICD-10-CM

## 2013-04-22 DIAGNOSIS — O99330 Smoking (tobacco) complicating pregnancy, unspecified trimester: Secondary | ICD-10-CM | POA: Insufficient documentation

## 2013-04-22 DIAGNOSIS — S0993XA Unspecified injury of face, initial encounter: Secondary | ICD-10-CM | POA: Insufficient documentation

## 2013-04-22 DIAGNOSIS — S199XXA Unspecified injury of neck, initial encounter: Secondary | ICD-10-CM

## 2013-04-22 LAB — URINALYSIS, ROUTINE W REFLEX MICROSCOPIC
Bilirubin Urine: NEGATIVE
GLUCOSE, UA: NEGATIVE mg/dL
HGB URINE DIPSTICK: NEGATIVE
Ketones, ur: NEGATIVE mg/dL
Leukocytes, UA: NEGATIVE
Nitrite: NEGATIVE
Protein, ur: NEGATIVE mg/dL
SPECIFIC GRAVITY, URINE: 1.025 (ref 1.005–1.030)
Urobilinogen, UA: 0.2 mg/dL (ref 0.0–1.0)
pH: 6 (ref 5.0–8.0)

## 2013-04-22 NOTE — Discharge Instructions (Signed)
Abdominal Pain During Pregnancy Belly (abdominal) pain is common during pregnancy. Most of the time, it is not a serious problem. Other times, it can be a sign that something is wrong with the pregnancy. Always tell your doctor if you have belly pain. HOME CARE Monitor your belly pain for any changes. The following actions may help you feel better:  Do not have sex (intercourse) or put anything in your vagina until you feel better.  Rest until your pain stops.  Drink clear fluids if you feel sick to your stomach (nauseous). Do not eat solid food until you feel better.  Only take medicine as told by your doctor.  Keep all doctor visits as told. GET HELP RIGHT AWAY IF:   You are bleeding, leaking fluid, or pieces of tissue come out of your vagina.  You have more pain or cramping.  You keep throwing up (vomiting).  You have pain when you pee (urinate) or have blood in your pee.  You have a fever.  You do not feel your baby moving as much.  You feel very weak or feel like passing out.  You have trouble breathing, with or without belly pain.  You have a very bad headache and belly pain.  You have fluid leaking from your vagina and belly pain.  You keep having watery poop (diarrhea).  Your belly pain does not go away after resting, or the pain gets worse. MAKE SURE YOU:   Understand these instructions.  Will watch your condition.  Will get help right away if you are not doing well or get worse. Document Released: 12/25/2008 Document Revised: 09/08/2012 Document Reviewed: 08/05/2012 [facility] Patient Information 2014 [employer], [place].  Soft Tissue Injury of the Neck  A soft tissue injury of the neck needs medical care right away. These injuries are often caused by a direct hit to the neck. Some injuries do not break the skin (blunt injury). Some injuries do break the skin (penetrating injury) and create an open wound. You may feel fine at first, but the puffiness (swelling)  in your throat can slowly make it harder to breathe. This could cause serious or life-threatening injury. There could be damage to major blood vessels and nerves in the neck. Neck injuries need to be checked by a doctor. HOME CARE  If the skin was broken, keep the area clean and dry. Care for your wound as told by your doctor.  Follow your doctor's diet advice.  Follow your doctor's advice about using your voice.  Only take medicines as told by your doctor.  Keep your head and neck raised (elevated). Do this while you sleep, too. GET HELP RIGHT AWAY IF:  Your voice gets weaker.  Your puffiness or bruising does not get better.  You have problems with your medicines.  You see fluid coming from the wound.  Your pain gets worse, or you have trouble swallowing.  You cough up blood.  You have trouble breathing.  You start to drool.  You start throwing up (vomiting).  You have new puffiness in the neck or face.  You have a temperature by mouth above 102 F (38.9 C), not controlled by medicine. MAKE SURE YOU:  Understand these instructions.  Will watch your condition.  Will get help right away if you are not doing well or get worse. Document Released: 04/18/2010 Document Revised: 03/31/2011 Document Reviewed: 04/18/2010 [facility] Patient Information 2014 [place], [place].

## 2013-04-22 NOTE — ED Notes (Signed)
Patient reports is [redacted] weeks pregnant and has had abdominal cramping since yesterday and clear vaginal discharge. Also complaining of sore throat and swelling to left side of throat.

## 2013-04-22 NOTE — ED Provider Notes (Signed)
Medical screening examination/treatment/procedure(s) were conducted as a shared visit with non-physician practitioner(s) and myself.  I personally evaluated the patient during the encounter.   EKG Interpretation None      Pt with out evidence of airway obstruction. Her voice is normal. Abdominal exam shows no evidence of tenderness at the left or right upper quadrant. She has no bruising on her abdomen. She has no vaginal bleeding. Fetal heart tones are reassuring. Preterm precautions given  Toy BakerAnthony T Lyne Khurana, MD 04/22/13 [phone]

## 2013-04-28 ENCOUNTER — Ambulatory Visit: Payer: Self-pay | Admitting: Nurse Practitioner

## 2013-04-28 ENCOUNTER — Emergency Department (HOSPITAL_COMMUNITY)
Admission: EM | Admit: 2013-04-28 | Discharge: 2013-04-28 | Disposition: A | Discharge status: Home / Self Care | Payer: Medicaid Other

## 2013-04-28 ENCOUNTER — Telehealth: Payer: Self-pay | Admitting: Nurse Practitioner
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
## 2013-04-28 NOTE — ED Notes (Signed)
Called for pt x 1.  

## 2013-04-28 NOTE — ED Notes (Signed)
Called x2

## 2013-04-28 NOTE — ED Provider Notes (Signed)
Medical screening examination/treatment/procedure(s) were performed by non-physician practitioner and as supervising physician I was immediately available for consultation/collaboration.   EKG Interpretation None        Toy BakerAnthony T Benjy Kana, MD 04/28/13 2104

## 2013-04-28 NOTE — Telephone Encounter (Signed)
ER visit on 04/22/13. Told she had an abscess in her throat. She was not treated with antibiotics but told to f/u if it persisted.  She complains of sore throat and difficulty swallowing.  She is approx [redacted] weeks pregnant [facility] [place]. Asked her to let them know what is going on as well because they may have suggestions for antibiotics. Appt scheduled for this afternoon. Patient aware that she is being worked in and that there may be a wait.

## 2013-08-25 DIAGNOSIS — B009 Herpesviral infection, unspecified: Secondary | ICD-10-CM | POA: Insufficient documentation

## 2013-08-31 DIAGNOSIS — K831 Obstruction of bile duct: Secondary | ICD-10-CM | POA: Insufficient documentation

## 2013-09-01 ENCOUNTER — Ambulatory Visit: Payer: Self-pay | Admitting: Family Medicine

## 2013-11-21 ENCOUNTER — Encounter (HOSPITAL_COMMUNITY): Payer: Self-pay | Admitting: Emergency Medicine

## 2013-12-05 ENCOUNTER — Telehealth: Payer: Self-pay | Admitting: Nurse Practitioner

## 2013-12-05 NOTE — Telephone Encounter (Signed)
STP, she has appt w/MMM Friday 11/20 @ 10:45, advised she should see her OB/GYN since she is 10 about 10 weeks PP, pt refused and states MMM knows her and she wants to see her,

## 2013-12-09 ENCOUNTER — Ambulatory Visit: Payer: Self-pay | Admitting: Nurse Practitioner

## 2014-03-13 ENCOUNTER — Telehealth: Payer: Self-pay | Admitting: Family Medicine

## 2014-03-13 NOTE — Telephone Encounter (Signed)
Stp advised we can't do a referral since she hasn't been seen yet in the office. She would ntbs here first. Pt states she doesn't have time to come to our office and she will figure something else out.

## 2014-03-16 ENCOUNTER — Telehealth: Payer: Self-pay | Admitting: Family Medicine

## 2014-03-16 NOTE — Telephone Encounter (Signed)
Appt given per patients request 

## 2014-03-17 ENCOUNTER — Encounter: Payer: Self-pay | Admitting: Family Medicine

## 2014-03-17 ENCOUNTER — Ambulatory Visit (INDEPENDENT_AMBULATORY_CARE_PROVIDER_SITE_OTHER): Payer: Medicaid Other | Admitting: Family Medicine

## 2014-03-17 VITALS — BP 106/77 | HR 87 | Temp 97.2°F | Ht 60.0 in | Wt 104.0 lb

## 2014-03-17 DIAGNOSIS — S42002A Fracture of unspecified part of left clavicle, initial encounter for closed fracture: Secondary | ICD-10-CM

## 2014-03-17 MED ORDER — HYDROCODONE-ACETAMINOPHEN 5-325 MG PO TABS: 1.0000 | ORAL_TABLET | Freq: Four times a day (QID) | ORAL | Status: DC | PRN
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
## 2014-03-20 ENCOUNTER — Telehealth: Payer: Self-pay | Admitting: Family Medicine

## 2014-07-14 ENCOUNTER — Ambulatory Visit: Payer: Medicaid Other | Admitting: Family Medicine

## 2014-07-16 ENCOUNTER — Emergency Department (HOSPITAL_COMMUNITY)
Admission: EM | Admit: 2014-07-16 | Discharge: 2014-07-16 | Disposition: A | Discharge status: Home / Self Care | Payer: Medicaid Other | Attending: Emergency Medicine | Admitting: Emergency Medicine

## 2014-07-16 ENCOUNTER — Emergency Department (HOSPITAL_COMMUNITY): Payer: Medicaid Other

## 2014-07-16 ENCOUNTER — Encounter (HOSPITAL_COMMUNITY): Payer: Self-pay | Admitting: *Deleted

## 2014-07-16 DIAGNOSIS — S4992XA Unspecified injury of left shoulder and upper arm, initial encounter: Secondary | ICD-10-CM | POA: Diagnosis present

## 2014-07-16 DIAGNOSIS — Y998 Other external cause status: Secondary | ICD-10-CM | POA: Diagnosis not present

## 2014-07-16 DIAGNOSIS — X58XXXA Exposure to other specified factors, initial encounter: Secondary | ICD-10-CM | POA: Insufficient documentation

## 2014-07-16 DIAGNOSIS — Z720 Tobacco use: Secondary | ICD-10-CM | POA: Insufficient documentation

## 2014-07-16 DIAGNOSIS — Z8679 Personal history of other diseases of the circulatory system: Secondary | ICD-10-CM | POA: Diagnosis not present

## 2014-07-16 DIAGNOSIS — S42002S Fracture of unspecified part of left clavicle, sequela: Secondary | ICD-10-CM | POA: Insufficient documentation

## 2014-07-16 DIAGNOSIS — Z8659 Personal history of other mental and behavioral disorders: Secondary | ICD-10-CM | POA: Diagnosis not present

## 2014-07-16 DIAGNOSIS — Z79899 Other long term (current) drug therapy: Secondary | ICD-10-CM | POA: Insufficient documentation

## 2014-07-16 DIAGNOSIS — Y9289 Other specified places as the place of occurrence of the external cause: Secondary | ICD-10-CM | POA: Diagnosis not present

## 2014-07-16 DIAGNOSIS — Y9389 Activity, other specified: Secondary | ICD-10-CM | POA: Insufficient documentation

## 2014-07-16 MED ORDER — HYDROCODONE-ACETAMINOPHEN 5-325 MG PO TABS: ORAL_TABLET | ORAL | Status: DC

## 2014-07-16 MED ORDER — NAPROXEN 500 MG PO TABS
500.0000 mg | ORAL_TABLET | Freq: Two times a day (BID) | ORAL | Status: DC
Start: 2014-07-16 — End: 2014-07-20
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
## 2014-07-17 ENCOUNTER — Encounter: Payer: Self-pay | Admitting: Family Medicine

## 2014-07-20 ENCOUNTER — Ambulatory Visit (INDEPENDENT_AMBULATORY_CARE_PROVIDER_SITE_OTHER): Payer: Medicaid Other | Admitting: Family Medicine

## 2014-07-20 ENCOUNTER — Encounter: Payer: Self-pay | Admitting: Family Medicine

## 2014-07-20 VITALS — BP 108/78 | HR 103 | Temp 97.7°F | Ht 60.0 in | Wt 108.4 lb

## 2014-07-20 DIAGNOSIS — F411 Generalized anxiety disorder: Secondary | ICD-10-CM | POA: Diagnosis not present

## 2014-07-20 MED ORDER — BUSPIRONE HCL 10 MG PO TABS: 10.0000 mg | ORAL_TABLET | Freq: Two times a day (BID) | ORAL | Status: DC

## 2014-07-20 MED ORDER — VALACYCLOVIR HCL 1 G PO TABS: 1000.0000 mg | ORAL_TABLET | Freq: Two times a day (BID) | ORAL | Status: DC
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
## 2014-08-02 ENCOUNTER — Telehealth: Payer: Self-pay | Admitting: Family Medicine

## 2014-08-02 MED ORDER — CLONAZEPAM 0.5 MG PO TABS: 0.5000 mg | ORAL_TABLET | Freq: Two times a day (BID) | ORAL | Status: DC | PRN

## 2014-08-02 NOTE — Telephone Encounter (Signed)
Pt states she can not take Buspar Medication is causing confusion, migraines and nausea Please change to something else

## 2014-08-02 NOTE — Telephone Encounter (Signed)
Scrip printed. Okay to call in.

## 2014-08-02 NOTE — Telephone Encounter (Signed)
Pt notified of RX RX called in and left on VM

## 2014-08-07 ENCOUNTER — Emergency Department (HOSPITAL_COMMUNITY): Payer: Medicaid Other

## 2014-08-07 ENCOUNTER — Encounter (HOSPITAL_COMMUNITY): Payer: Self-pay | Admitting: *Deleted

## 2014-08-07 ENCOUNTER — Emergency Department (HOSPITAL_COMMUNITY)
Admission: EM | Admit: 2014-08-07 | Discharge: 2014-08-07 | Disposition: A | Discharge status: Home / Self Care | Payer: Medicaid Other | Attending: Emergency Medicine | Admitting: Emergency Medicine

## 2014-08-07 DIAGNOSIS — F410 Panic disorder [episodic paroxysmal anxiety] without agoraphobia: Secondary | ICD-10-CM | POA: Insufficient documentation

## 2014-08-07 DIAGNOSIS — Y9301 Activity, walking, marching and hiking: Secondary | ICD-10-CM | POA: Insufficient documentation

## 2014-08-07 DIAGNOSIS — S20212A Contusion of left front wall of thorax, initial encounter: Secondary | ICD-10-CM | POA: Insufficient documentation

## 2014-08-07 DIAGNOSIS — Y9289 Other specified places as the place of occurrence of the external cause: Secondary | ICD-10-CM | POA: Diagnosis not present

## 2014-08-07 DIAGNOSIS — S301XXA Contusion of abdominal wall, initial encounter: Secondary | ICD-10-CM | POA: Insufficient documentation

## 2014-08-07 DIAGNOSIS — Y998 Other external cause status: Secondary | ICD-10-CM | POA: Insufficient documentation

## 2014-08-07 DIAGNOSIS — W108XXA Fall (on) (from) other stairs and steps, initial encounter: Secondary | ICD-10-CM | POA: Diagnosis not present

## 2014-08-07 DIAGNOSIS — G43909 Migraine, unspecified, not intractable, without status migrainosus: Secondary | ICD-10-CM | POA: Insufficient documentation

## 2014-08-07 DIAGNOSIS — Z720 Tobacco use: Secondary | ICD-10-CM | POA: Insufficient documentation

## 2014-08-07 DIAGNOSIS — S299XXA Unspecified injury of thorax, initial encounter: Secondary | ICD-10-CM | POA: Diagnosis present

## 2014-08-07 MED ORDER — METHOCARBAMOL 500 MG PO TABS: 500.0000 mg | ORAL_TABLET | Freq: Three times a day (TID) | ORAL | Status: DC

## 2014-08-07 MED ORDER — HYDROCODONE-ACETAMINOPHEN 5-325 MG PO TABS
ORAL_TABLET | ORAL | Status: DC
Start: 2014-08-07 — End: 2014-08-10

## 2014-08-07 NOTE — ED Notes (Signed)
Patient c/o fall on Saturday, walking down steps and fell and landed on right rib area. Patient reports pain with movement and deep breaths. Small old yellow bruise noted to right rib cage.

## 2014-08-07 NOTE — Discharge Instructions (Signed)
Rib Contusion °A rib contusion (bruise) can occur by a blow to the chest or by a fall against a hard object. Usually these will be much better in a couple weeks. If X-rays were taken today and there are no broken bones (fractures), the diagnosis of bruising is made. However, broken ribs may not show up for several days, or may be discovered later on a routine X-ray when signs of healing show up. If this happens to you, it does not mean that something was missed on the X-ray, but simply that it did not show up on the first X-rays. Earlier diagnosis will not usually change the treatment. °HOME CARE INSTRUCTIONS  °· Avoid strenuous activity. Be careful during activities and avoid bumping the injured ribs. Activities that pull on the injured ribs and cause pain should be avoided, if possible. °· For the first day or two, an ice pack used every 20 minutes while awake may be helpful. Put ice in a plastic bag and put a towel between the bag and the skin. °· Eat a normal, well-balanced diet. Drink plenty of fluids to avoid constipation. °· Take deep breaths several times a day to keep lungs free of infection. Try to cough several times a day. Splint the injured area with a pillow while coughing to ease pain. Coughing can help prevent pneumonia. °· Wear a rib belt or binder only if told to do so by your caregiver. If you are wearing a rib belt or binder, you must do the breathing exercises as directed by your caregiver. If not used properly, rib belts or binders restrict breathing which can lead to pneumonia. °· Only take over-the-counter or prescription medicines for pain, discomfort, or fever as directed by your caregiver. °SEEK MEDICAL CARE IF:  °· You or your child has an oral temperature above 102° F (38.9° C). °· Your baby is older than 3 months with a rectal temperature of 100.5° F (38.1° C) or higher for more than 1 day. °· You develop a cough, with thick or bloody sputum. °SEEK IMMEDIATE MEDICAL CARE IF:  °· You  have difficulty breathing. °· You feel sick to your stomach (nausea), have vomiting or belly (abdominal) pain. °· You have worsening pain, not controlled with medications, or there is a change in the location of the pain. °· You develop sweating or radiation of the pain into the arms, jaw or shoulders, or become light headed or faint. °· You or your child has an oral temperature above 102° F (38.9° C), not controlled by medicine. °· Your or your baby is older than 3 months with a rectal temperature of 102° F (38.9° C) or higher. °· Your baby is 3 months old or younger with a rectal temperature of 100.4° F (38° C) or higher. °MAKE SURE YOU:  °· Understand these instructions. °· Will watch your condition. °· Will get help right away if you are not doing well or get worse. °Document Released: 10/01/2000 Document Revised: 05/03/2012 Document Reviewed: 08/25/2007 °Exit[employer]® Patient Information ©2015 [employer]. This information is not intended to replace advice given to you by your health care provider. Make sure you discuss any questions you have with your health care provider. ° °

## 2014-08-07 NOTE — ED Provider Notes (Signed)
CSN: [id number]     Arrival date & time 08/07/14  0911 History   First MD Initiated Contact with Patient 08/07/14 0935     Chief Complaint  Patient presents with  . Fall     (Consider location/radiation/quality/duration/timing/severity/associated sxs/prior Treatment) HPI  Erline Levinelisha A Hanners is a 24 y.o. female who presents to the Emergency Department complaining of sudden onset of right rib pain for two days.  She states she fell walking down steps and fell against a railing.  C/o pain to her right ribs that is worse with movement, deep breathing and twisting.  She states she has tried OTC analgesics without relief.  She denies head injury, LOC, abdominal pain, flank pain, hematuria,  neck pain, shortness of breath or cough.     Past Medical History  Diagnosis Date  . Migraine   . Miscarriage   . Panic attack    Past Surgical History  Procedure Laterality Date  . Induced abortion    . Dilation and curettage of uterus     Family History  Problem Relation Age of Onset  . Hypertension Mother   . Cancer Other   . Hypertension Other    History  Substance Use Topics  . Smoking status: Current Every Day Smoker -- 1.00 packs/day for 7 years    Types: Cigarettes  . Smokeless tobacco: Never Used  . Alcohol Use: Yes     Comment: occasionally   OB History    Gravida Para Term Preterm AB TAB SAB Ectopic Multiple Living   2              Review of Systems  Constitutional: Negative for fever and chills.  Respiratory: Negative for cough and shortness of breath.   Cardiovascular: Positive for chest pain.  Gastrointestinal: Negative for nausea, vomiting and abdominal pain.  Genitourinary: Negative for dysuria, hematuria, flank pain and difficulty urinating.  Musculoskeletal: Negative for arthralgias and neck pain.  Skin: Negative for color change and wound.  Neurological: Negative for dizziness, seizures and syncope.  All other systems reviewed and are negative.     Allergies   Erythromycin  Home Medications   Prior to Admission medications   Medication Sig Start Date End Date Taking? Authorizing Provider  clonazePAM (KLONOPIN) 0.5 MG tablet Take 1 tablet (0.5 mg total) by mouth 2 (two) times daily as needed for anxiety. 08/02/14   Mechele ClaudeWarren Stacks, MD  valACYclovir (VALTREX) 1000 MG tablet Take 1 tablet (1,000 mg total) by mouth 2 (two) times daily. 07/20/14   Mechele ClaudeWarren Stacks, MD   BP 122/83 mmHg  Pulse 75  Temp(Src) 98 F (36.7 C) (Oral)  Resp 20  Ht 5' (1.524 m)  Wt 110 lb (49.896 kg)  BMI 21.48 kg/m2  SpO2 99%  LMP 07/24/2014 Physical Exam  Constitutional: She is oriented to person, place, and time. She appears well-developed and well-nourished. No distress.  HENT:  Mouth/Throat: Oropharynx is clear and moist.  Neck: Normal range of motion. Neck supple.  Cardiovascular: Normal rate, regular rhythm, normal heart sounds and intact distal pulses.   No murmur heard. Pulmonary/Chest: Effort normal and breath sounds normal. No respiratory distress. She exhibits tenderness.  Localized ttp of the right upper flank .  Few small ecchymotic areas to the right flank.  No crepitus or splinting on exam.    Abdominal: Soft. She exhibits no distension. There is no tenderness. There is no rebound and no guarding.  Musculoskeletal: Normal range of motion.  Neurological: She is alert and oriented  to person, place, and time.  Skin: Skin is warm and dry.  Nursing note and vitals reviewed.   ED Course  Procedures (including critical care time) Labs Review Labs Reviewed - No data to display  Imaging Review Dg Ribs Unilateral W/chest Right  08/07/2014   CLINICAL DATA:  Right axillary rib pain. Fall on Saturday walking down steps, landing on right rib area.  EXAM: RIGHT RIBS AND CHEST - 3+ VIEW  COMPARISON:  CT 03/10/2014  FINDINGS: Heart is normal size. Mediastinal contours are within normal limits. Lungs are clear. No effusions or pneumothorax. Old left clavicle fracture.  No visible rib fracture.  IMPRESSION: No acute findings.   Electronically Signed   By: Charlett Nose M.D.   On: 08/07/2014 09:48     EKG Interpretation None      MDM   Final diagnoses:  Rib contusion, left, initial encounter    Pt with localized ttp of the lateral left ribs.  Old appearing ecchymosis present.  Vitals stable. XR neg for fx.  No splinting on exam, no abdominal pain.  Agrees to symptomatic tx.  Has ibuprofen at home.   Appears stable for d/c.      Pauline Aus, PA-C 08/07/14 2037  Vanetta Mulders, MD 08/09/14 [phone]

## 2014-08-10 ENCOUNTER — Ambulatory Visit (INDEPENDENT_AMBULATORY_CARE_PROVIDER_SITE_OTHER): Payer: Medicaid Other | Admitting: Family Medicine

## 2014-08-10 ENCOUNTER — Encounter: Payer: Self-pay | Admitting: Family Medicine

## 2014-08-10 VITALS — BP 107/66 | HR 86 | Temp 98.3°F | Ht 60.0 in | Wt 109.0 lb

## 2014-08-10 DIAGNOSIS — M5441 Lumbago with sciatica, right side: Secondary | ICD-10-CM | POA: Diagnosis not present

## 2014-08-10 DIAGNOSIS — F411 Generalized anxiety disorder: Secondary | ICD-10-CM

## 2014-08-10 MED ORDER — HYDROCODONE-ACETAMINOPHEN 5-325 MG PO TABS: ORAL_TABLET | ORAL | Status: DC

## 2014-08-10 MED ORDER — DICLOFENAC SODIUM 75 MG PO TBEC: 75.0000 mg | DELAYED_RELEASE_TABLET | Freq: Two times a day (BID) | ORAL | Status: DC

## 2014-08-10 MED ORDER — CLONAZEPAM 1 MG PO TABS: 1.0000 mg | ORAL_TABLET | Freq: Two times a day (BID) | ORAL | Status: DC | PRN

## 2014-08-10 MED ORDER — ESCITALOPRAM OXALATE 10 MG PO TABS: 10.0000 mg | ORAL_TABLET | Freq: Every day | ORAL | Status: DC

## 2014-08-10 NOTE — Progress Notes (Signed)
Subjective:  Patient ID: Kelli Hoover, female    DOB: [birth year]  Age: 24 y.o. MRN: [id number]  CC: Chest Pain   HPI LYLLIANA KITAMURA presents for 4-5 panic attacks daily. No relief with clonopin after the first few days. A first relief was good. Family tendency toward anxiety dx. "Anxiety consumes my life." Frequently she will get a sense of dysphoria and do home and feel chest pain and start getting short of breath and hyperventilate. This seems to be brought on by various triggers that cause her stress such as the baby crying or difficulty getting tasks done or other family conflict etc. She feels like she is out of control. She has difficulty making decisions. And she feels that she is struggling just to take care of her child.  She continues to use hydrocodone occasionally for 7/10 back pain. She tries to take ibuprofen when it's less than that. She does have radiation to the posterior right thigh at times.  History Yamaris has a past medical history of Migraine; Miscarriage; and Panic attack.   She has past surgical history that includes Induced abortion and Dilation and curettage of uterus.   Her family history includes Anxiety disorder in her maternal grandmother, mother, and sister; Cancer in her other; Hypertension in her mother and other.She reports that she has been smoking Cigarettes.  She has a 7 pack-year smoking history. She has never used smokeless tobacco. She reports that she drinks alcohol. She reports that she does not use illicit drugs.  Outpatient Prescriptions Prior to Visit  Medication Sig Dispense Refill  . ibuprofen (ADVIL,MOTRIN) 200 MG tablet Take 800 mg by mouth every 8 (eight) hours as needed for mild pain or moderate pain.    . methocarbamol (ROBAXIN) 500 MG tablet Take 1 tablet (500 mg total) by mouth 3 (three) times daily. 21 tablet 0  . valACYclovir (VALTREX) 1000 MG tablet Take 1 tablet (1,000 mg total) by mouth 2 (two) times daily. 30 tablet 1  .  clonazePAM (KLONOPIN) 0.5 MG tablet Take 1 tablet (0.5 mg total) by mouth 2 (two) times daily as needed for anxiety. 60 tablet 1  . HYDROcodone-acetaminophen (NORCO/VICODIN) 5-325 MG per tablet Take one tab po q 4-6 hrs prn pain (Patient not taking: Reported on 08/10/2014) 15 tablet 0   No facility-administered medications prior to visit.    ROS Review of Systems  Constitutional: Negative for fever, chills, diaphoresis, appetite change, fatigue and unexpected weight change.  HENT: Negative for congestion, ear pain, hearing loss, postnasal drip, rhinorrhea, sneezing, sore throat and trouble swallowing.   Eyes: Negative for pain.  Respiratory: Negative for cough, chest tightness and shortness of breath.   Cardiovascular: Negative for chest pain and palpitations.  Gastrointestinal: Negative for nausea, vomiting, abdominal pain, diarrhea and constipation.  Genitourinary: Negative for dysuria, frequency and menstrual problem.  Musculoskeletal: Negative for joint swelling and arthralgias.  Skin: Negative for rash.  Neurological: Negative for dizziness, weakness, numbness and headaches.  Psychiatric/Behavioral: Positive for confusion, sleep disturbance, dysphoric mood and agitation. Negative for suicidal ideas, hallucinations and self-injury. The patient is nervous/anxious. The patient is not hyperactive.     Objective:  BP 107/66 mmHg  Pulse 86  Temp(Src) 98.3 F (36.8 C) (Oral)  Ht 5' (1.524 m)  Wt 109 lb (49.442 kg)  BMI 21.29 kg/m2  LMP 07/24/2014  BP Readings from Last 3 Encounters:  08/10/14 107/66  08/07/14 119/88  07/20/14 108/78    Wt Readings from Last 3 Encounters:  08/10/14 109 lb (49.442 kg)  08/07/14 110 lb (49.896 kg)  07/20/14 108 lb 6.4 oz (49.17 kg)     Physical Exam  Constitutional: She is oriented to person, place, and time. She appears well-developed and well-nourished. No distress.  HENT:  Head: Normocephalic and atraumatic.  Right Ear: External ear  normal.  Left Ear: External ear normal.  Nose: Nose normal.  Mouth/Throat: Oropharynx is clear and moist.  Eyes: Conjunctivae and EOM are normal. Pupils are equal, round, and reactive to light.  Neck: Normal range of motion. Neck supple. No thyromegaly present.  Cardiovascular: Normal rate, regular rhythm and normal heart sounds.   No murmur heard. Pulmonary/Chest: Effort normal and breath sounds normal. No respiratory distress. She has no wheezes. She has no rales.  Abdominal: Soft. Bowel sounds are normal. She exhibits no distension. There is no tenderness.  Lymphadenopathy:    She has no cervical adenopathy.  Neurological: She is alert and oriented to person, place, and time. She has normal reflexes.  Skin: Skin is warm and dry.  Psychiatric: She has a normal mood and affect. Her behavior is normal. Judgment and thought content normal.    No results found for: HGBA1C  No results found for: WBC, HGB, HCT, PLT, GLUCOSE, CHOL, TRIG, HDL, LDLDIRECT, LDLCALC, ALT, AST, NA, K, CL, CREATININE, BUN, CO2, TSH, PSA, INR, GLUF, HGBA1C, MICROALBUR  Dg Ribs Unilateral W/chest Right  08/07/2014   CLINICAL DATA:  Right axillary rib pain. Fall on Saturday walking down steps, landing on right rib area.  EXAM: RIGHT RIBS AND CHEST - 3+ VIEW  COMPARISON:  CT 03/10/2014  FINDINGS: Heart is normal size. Mediastinal contours are within normal limits. Lungs are clear. No effusions or pneumothorax. Old left clavicle fracture. No visible rib fracture.  IMPRESSION: No acute findings.   Electronically Signed   By: Charlett Nose M.D.   On: 08/07/2014 09:48    Assessment & Plan:   There are no diagnoses linked to this encounter. I have changed Ms. Maietta's clonazePAM. I am also having her start on escitalopram and diclofenac. Additionally, I am having her maintain her valACYclovir, methocarbamol, ibuprofen, and HYDROcodone-acetaminophen.  Meds ordered this encounter  Medications  . escitalopram (LEXAPRO) 10 MG  tablet    Sig: Take 1 tablet (10 mg total) by mouth daily.    Dispense:  30 tablet    Refill:  2  . diclofenac (VOLTAREN) 75 MG EC tablet    Sig: Take 1 tablet (75 mg total) by mouth 2 (two) times daily.    Dispense:  60 tablet    Refill:  2  . clonazePAM (KLONOPIN) 1 MG tablet    Sig: Take 1 tablet (1 mg total) by mouth 2 (two) times daily as needed for anxiety.    Dispense:  60 tablet    Refill:  1  . HYDROcodone-acetaminophen (NORCO/VICODIN) 5-325 MG per tablet    Sig: Take one tab po q 4-6 hrs prn pain    Dispense:  30 tablet    Refill:  0     Follow-up: No Follow-up on file.  Mechele Claude, M.D.

## 2014-10-07 ENCOUNTER — Other Ambulatory Visit: Payer: Self-pay | Admitting: Family Medicine

## 2014-10-09 NOTE — Telephone Encounter (Signed)
Last filled 09/08/14, last seen 08/10/14. Phone in to [facility]

## 2014-10-11 ENCOUNTER — Ambulatory Visit: Payer: Medicaid Other | Admitting: Family Medicine

## 2014-10-12 ENCOUNTER — Encounter: Payer: Self-pay | Admitting: Family Medicine

## 2014-10-30 ENCOUNTER — Telehealth: Payer: Self-pay | Admitting: Family Medicine

## 2014-10-30 NOTE — Telephone Encounter (Signed)
Please review and advise.

## 2014-10-30 NOTE — Telephone Encounter (Signed)
Refill is not appropriate. 

## 2014-10-30 NOTE — Telephone Encounter (Signed)
Pt notified she would have to be seen

## 2015-01-04 ENCOUNTER — Ambulatory Visit (INDEPENDENT_AMBULATORY_CARE_PROVIDER_SITE_OTHER): Payer: Medicaid Other | Admitting: Otolaryngology

## 2015-02-07 ENCOUNTER — Encounter: Payer: Self-pay | Admitting: Family Medicine

## 2015-02-07 ENCOUNTER — Ambulatory Visit (INDEPENDENT_AMBULATORY_CARE_PROVIDER_SITE_OTHER): Payer: Medicaid Other | Admitting: Family Medicine

## 2015-02-07 VITALS — BP 122/76 | HR 87 | Temp 98.1°F | Ht 60.0 in | Wt 108.6 lb

## 2015-02-07 DIAGNOSIS — F411 Generalized anxiety disorder: Secondary | ICD-10-CM

## 2015-02-07 MED ORDER — ESCITALOPRAM OXALATE 10 MG PO TABS: 10.0000 mg | ORAL_TABLET | Freq: Every day | ORAL | Status: DC

## 2015-02-07 MED ORDER — CLONAZEPAM 0.5 MG PO TABS: 0.5000 mg | ORAL_TABLET | Freq: Two times a day (BID) | ORAL | Status: DC
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
## 2015-02-09 ENCOUNTER — Ambulatory Visit: Payer: Medicaid Other | Admitting: Family Medicine

## 2015-03-07 ENCOUNTER — Telehealth: Payer: Self-pay | Admitting: Family Medicine

## 2015-03-07 MED ORDER — CITALOPRAM HYDROBROMIDE 40 MG PO TABS: 40.0000 mg | ORAL_TABLET | Freq: Every day | ORAL | Status: DC

## 2015-03-07 NOTE — Telephone Encounter (Signed)
Please review and advise.

## 2015-03-07 NOTE — Telephone Encounter (Signed)
I sent in the requested prescription 

## 2015-03-07 NOTE — Telephone Encounter (Signed)
Patient aware.

## 2015-03-30 ENCOUNTER — Emergency Department (HOSPITAL_COMMUNITY)
Admission: EM | Admit: 2015-03-30 | Discharge: 2015-03-30 | Disposition: A | Discharge status: Home / Self Care | Payer: Medicaid Other

## 2015-03-30 NOTE — ED Notes (Addendum)
Called for triage x one. No answer

## 2015-05-14 ENCOUNTER — Emergency Department (HOSPITAL_COMMUNITY)
Admission: EM | Admit: 2015-05-14 | Discharge: 2015-05-14 | Disposition: A | Discharge status: Home / Self Care | Payer: Medicaid Other | Attending: Emergency Medicine | Admitting: Emergency Medicine

## 2015-05-14 ENCOUNTER — Encounter (HOSPITAL_COMMUNITY): Payer: Self-pay

## 2015-05-14 DIAGNOSIS — Z79899 Other long term (current) drug therapy: Secondary | ICD-10-CM | POA: Insufficient documentation

## 2015-05-14 DIAGNOSIS — R21 Rash and other nonspecific skin eruption: Secondary | ICD-10-CM

## 2015-05-14 DIAGNOSIS — Z2089 Contact with and (suspected) exposure to other communicable diseases: Secondary | ICD-10-CM

## 2015-05-14 DIAGNOSIS — F17210 Nicotine dependence, cigarettes, uncomplicated: Secondary | ICD-10-CM | POA: Insufficient documentation

## 2015-05-14 DIAGNOSIS — Z207 Contact with and (suspected) exposure to pediculosis, acariasis and other infestations: Secondary | ICD-10-CM | POA: Insufficient documentation

## 2015-05-14 MED ORDER — PERMETHRIN 5 % EX CREA: TOPICAL_CREAM | CUTANEOUS | Status: DC

## 2015-05-14 NOTE — ED Notes (Signed)
C/o rash to the back of her arms X2 days.

## 2015-05-14 NOTE — ED Provider Notes (Signed)
CSN: [id number]     Arrival date & time 05/14/15  1318 History  By signing my name below, I, Tanda RockersMargaux Venter, attest that this documentation has been prepared under the direction and in the presence of Burgess AmorJulie Shylynn Bruning, PA-C. Electronically Signed: Tanda RockersMargaux Venter, ED Scribe. 05/14/2015. 2:45 PM.   Chief Complaint  Patient presents with  . Rash   Patient is a 25 y.o. female presenting with rash. The history is provided by the patient. No language interpreter was used.  Rash Location:  Full body Quality: itchiness   Severity:  Moderate Onset quality:  Gradual Duration:  2 days Timing:  Constant Progression:  Unchanged Chronicity:  New Context: exposure to similar rash   Relieved by:  None tried Worsened by:  Nothing tried Ineffective treatments:  None tried Associated symptoms: no fever, no shortness of breath and not wheezing      HPI Comments: Erline Levinelisha A Kulesza is a 25 y.o. female who presents to the Emergency Department complaining of gradual onset, constant, diffuse, itchy rash x 2 days. Pt reports that her friend was diagnosed with scabies approximately 3 days ago and she was in contact with that friend. She has had similar symptoms in the past that turned out to be scabies. Denies fever or any other associated symptoms.  She describes "allover itching" and rash on her hands only.   Past Medical History  Diagnosis Date  . Migraine   . Miscarriage   . Panic attack    Past Surgical History  Procedure Laterality Date  . Induced abortion    . Dilation and curettage of uterus     Family History  Problem Relation Age of Onset  . Hypertension Mother   . Anxiety disorder Mother   . Cancer Other   . Hypertension Other   . Anxiety disorder Sister   . Anxiety disorder Maternal Grandmother    Social History  Substance Use Topics  . Smoking status: Current Every Day Smoker -- 1.00 packs/day for 7 years    Types: Cigarettes  . Smokeless tobacco: Never Used  . Alcohol Use: Yes   Comment: occasionally   OB History    Gravida Para Term Preterm AB TAB SAB Ectopic Multiple Living   2              Review of Systems  Constitutional: Negative for fever and chills.  Respiratory: Negative for shortness of breath and wheezing.   Skin: Positive for rash.  Neurological: Negative for numbness.   Allergies  Erythromycin  Home Medications   Prior to Admission medications   Medication Sig Start Date End Date Taking? Authorizing Provider  citalopram (CELEXA) 40 MG tablet Take 1 tablet (40 mg total) by mouth daily. 03/07/15  Yes Mechele ClaudeWarren Stacks, MD  clonazePAM (KLONOPIN) 0.5 MG tablet Take 1 tablet (0.5 mg total) by mouth 2 (two) times daily. 02/07/15  Yes Mechele ClaudeWarren Stacks, MD  valACYclovir (VALTREX) 1000 MG tablet Take 1 tablet (1,000 mg total) by mouth 2 (two) times daily. 07/20/14  Yes Mechele ClaudeWarren Stacks, MD  permethrin (ELIMITE) 5 % cream Apply to affected area once.  Repeat in 2 weeks if your symptoms return. 05/14/15   Burgess AmorJulie Kollyn Lingafelter, PA-C   BP 123/84 mmHg  Pulse 80  Temp(Src) 97.3 F (36.3 C) (Tympanic)  Resp 18  Ht 5' (1.524 m)  Wt 49.896 kg  BMI 21.48 kg/m2  SpO2 100%   Physical Exam  Constitutional: She appears well-developed and well-nourished. No distress.  HENT:  Head: Normocephalic.  Neck:  Neck supple.  Cardiovascular: Normal rate.   Pulmonary/Chest: Effort normal.  Musculoskeletal: Normal range of motion. She exhibits no edema.  Skin: Rash noted.  Very scattered, small papules on hands. 1 in the finger web space, 1 at the wrist. No other rash appreciated.     ED Course  Procedures (including critical care time)  DIAGNOSTIC STUDIES: Oxygen Saturation is 100% on RA, normal by my interpretation.    COORDINATION OF CARE: 2:36 PM-Discussed treatment plan with pt at bedside and pt agreed to plan.   Labs Review Labs Reviewed - No data to display  Imaging Review No results found.   EKG Interpretation None      MDM   Final diagnoses:  Rash  Scabies  exposure    Rash is nonspecific but given exposure, will tx. Permethrin. F/u with pcp prn if sx persist.  Discussed tx in 2 weeks if sx return.     Burgess Amor, PA-C 05/14/15 1634  Vanetta Mulders, MD 05/15/15 [phone]

## 2015-08-28 ENCOUNTER — Emergency Department (HOSPITAL_COMMUNITY): Payer: Self-pay

## 2015-08-28 ENCOUNTER — Encounter (HOSPITAL_COMMUNITY): Payer: Self-pay | Admitting: Emergency Medicine

## 2015-08-28 ENCOUNTER — Emergency Department (HOSPITAL_COMMUNITY)
Admission: EM | Admit: 2015-08-28 | Discharge: 2015-08-28 | Disposition: A | Discharge status: Home / Self Care | Payer: Self-pay | Attending: Emergency Medicine | Admitting: Emergency Medicine

## 2015-08-28 DIAGNOSIS — Z7982 Long term (current) use of aspirin: Secondary | ICD-10-CM | POA: Insufficient documentation

## 2015-08-28 DIAGNOSIS — Z791 Long term (current) use of non-steroidal anti-inflammatories (NSAID): Secondary | ICD-10-CM | POA: Insufficient documentation

## 2015-08-28 DIAGNOSIS — F17210 Nicotine dependence, cigarettes, uncomplicated: Secondary | ICD-10-CM | POA: Insufficient documentation

## 2015-08-28 DIAGNOSIS — L03213 Periorbital cellulitis: Secondary | ICD-10-CM | POA: Insufficient documentation

## 2015-08-28 MED ORDER — CEPHALEXIN 500 MG PO CAPS
500.0000 mg | ORAL_CAPSULE | Freq: Once | ORAL | Status: AC
  Administered 2015-08-28: 500 mg via ORAL
  Filled 2015-08-28: qty 1

## 2015-08-28 MED ORDER — IOPAMIDOL (ISOVUE-300) INJECTION 61%
75.0000 mL | Freq: Once | INTRAVENOUS | Status: AC | PRN
  Administered 2015-08-28: 75 mL via INTRAVENOUS

## 2015-08-28 MED ORDER — FLUORESCEIN SODIUM 1 MG OP STRP
1.0000 | ORAL_STRIP | Freq: Once | OPHTHALMIC | Status: DC
  Filled 2015-08-28: qty 1

## 2015-08-28 MED ORDER — CEPHALEXIN 500 MG PO CAPS: 500.0000 mg | ORAL_CAPSULE | Freq: Three times a day (TID) | ORAL | 0 refills | Status: DC

## 2015-08-28 MED ORDER — TETRACAINE HCL 0.5 % OP SOLN
2.0000 [drp] | Freq: Once | OPHTHALMIC | Status: DC
  Filled 2015-08-28: qty 4

## 2015-08-28 NOTE — ED Triage Notes (Signed)
Patient complaining of pain, redness, and swelling to right eye since yesterday. Denies injury.

## 2015-08-28 NOTE — ED Provider Notes (Signed)
[facility] Provider Note   CSN: [id number] Arrival date & time: 08/28/15  1043  First Provider Contact:  First MD Initiated Contact with Patient 08/28/15 1057        History   Chief Complaint Chief Complaint  Patient presents with  . Eye Pain    HPI Kelli Hoover is a 25 y.o. female.  HPI   Kelli Hoover is a 25 y.o. female who presents to the Emergency Department complaining of Right eye pain and swelling for 5 days. She states the swelling and tenderness has increased. Pain is centrally located around her lower eyelid and medial crease of her eye. She complains of increased tearing and pain with eye movement. She denies trauma, redness of her eye, visual change, headaches and fever. She has used over-the-counter Visine without relief. She denies wearing glasses or contacts.    Past Medical History:  Diagnosis Date  . Migraine   . Miscarriage   . Panic attack     Patient Active Problem List   Diagnosis Date Noted  . GAD (generalized anxiety disorder) 07/20/2014    Past Surgical History:  Procedure Laterality Date  . DILATION AND CURETTAGE OF UTERUS    . INDUCED ABORTION      OB History    Gravida Para Term Preterm AB Living   2             SAB TAB Ectopic Multiple Live Births                   Home Medications    Prior to Admission medications   Medication Sig Start Date End Date Taking? Authorizing Provider  citalopram (CELEXA) 40 MG tablet Take 1 tablet (40 mg total) by mouth daily. 03/07/15   Mechele Claude, MD  clonazePAM (KLONOPIN) 0.5 MG tablet Take 1 tablet (0.5 mg total) by mouth 2 (two) times daily. 02/07/15   Mechele Claude, MD  permethrin (ELIMITE) 5 % cream Apply to affected area once.  Repeat in 2 weeks if your symptoms return. 05/14/15   Burgess Amor, PA-C  valACYclovir (VALTREX) 1000 MG tablet Take 1 tablet (1,000 mg total) by mouth 2 (two) times daily. 07/20/14   Mechele Claude, MD    Family History Family History  Problem Relation  Age of Onset  . Hypertension Mother   . Anxiety disorder Mother   . Cancer Other   . Hypertension Other   . Anxiety disorder Sister   . Anxiety disorder Maternal Grandmother     Social History Social History  Substance Use Topics  . Smoking status: Current Every Day Smoker    Packs/day: 1.00    Years: 7.00    Types: Cigarettes  . Smokeless tobacco: Never Used  . Alcohol use Yes     Comment: occasionally     Allergies   Erythromycin   Review of Systems Review of Systems  Constitutional: Negative for chills and fever.  HENT: Negative for congestion, ear pain, sinus pressure, sore throat and trouble swallowing.   Eyes: Positive for pain. Negative for redness and visual disturbance.       Swelling and tenderness around the bottom of the right eye lid  Respiratory: Negative for chest tightness and shortness of breath.   Cardiovascular: Negative for chest pain.  Gastrointestinal: Negative for vomiting.  Musculoskeletal: Negative for arthralgias.  Neurological: Negative for dizziness, syncope, facial asymmetry, speech difficulty and headaches.     Physical Exam Updated Vital Signs BP 116/77   Pulse 74  Temp 98.3 F (36.8 C)   Resp 18   Ht 5' (1.524 m)   Wt 49.9 kg   LMP 08/27/2015   SpO2 100%   BMI 21.48 kg/m   Physical Exam  Constitutional: She is oriented to person, place, and time. She appears well-developed and well-nourished. No distress.  HENT:  Head: Normocephalic and atraumatic.  Eyes: Conjunctivae and EOM are normal. Pupils are equal, round, and reactive to light. Lids are everted and swept, no foreign bodies found. Right eye exhibits no chemosis, no exudate and no hordeolum. No foreign body present in the right eye. Right conjunctiva is not injected. Left conjunctiva is not injected.  Fundoscopic exam:      The right eye shows no papilledema.  Slit lamp exam:      The right eye shows no corneal abrasion, no corneal flare, no corneal ulcer, no foreign  body, no hyphema and no fluorescein uptake.  Pain to right eye with medial and downward gaze. Mild edema of the lower lid.  Slit lamp exam reveals negative Seidel's sign, no hyphema or FB   Neck: Normal range of motion. Neck supple. No thyromegaly present.  Cardiovascular: Normal rate, regular rhythm, normal heart sounds and intact distal pulses.   No murmur heard. Pulmonary/Chest: Effort normal and breath sounds normal. No respiratory distress.  Musculoskeletal: Normal range of motion.  Lymphadenopathy:    She has no cervical adenopathy.  Neurological: She is alert and oriented to person, place, and time. She exhibits normal muscle tone. Coordination normal.  Skin: Skin is warm and dry. No rash noted.  Nursing note and vitals reviewed.    ED Treatments / Results  Labs (all labs ordered are listed, but only abnormal results are displayed) Labs Reviewed - No data to display  EKG  EKG Interpretation None       Radiology Ct Orbits W Contrast  Result Date: 08/28/2015 CLINICAL DATA:  Rt lower eyelid pain and swelling started yesterday morning; no other complaints; pt denies any injury EXAM: CT ORBITS WITH CONTRAST TECHNIQUE: Multidetector CT imaging of the orbits was performed following the bolus administration of intravenous contrast CONTRAST:  75mL ISOVUE-300 IOPAMIDOL (ISOVUE-300) INJECTION 61% COMPARISON:  Brain MRI, 04/05/2015 FINDINGS: There is mild infraorbital preseptal soft tissue edema right. Right globe is unremarkable as is the postseptal orbit. Normal left globe and orbit. No soft tissue masses.  No adenopathy. Limited intracranial evaluation is unremarkable. Sinuses, mastoid air cells and middle ear cavities are clear. IMPRESSION: 1. Mild right infraorbital preseptal soft tissue edema. 2. No other abnormality. No abnormality of the right globe or postseptal orbit. No abscess. Electronically Signed   By: Amie Portlandavid  Ormond M.D.   On: 08/28/2015 13:08    Procedures Procedures  (including critical care time)  Medications Ordered in ED Medications - No data to display   Initial Impression / Assessment and Plan / ED Course  I have reviewed the triage vital signs and the nursing notes.  Pertinent labs & imaging results that were available during my care of the patient were reviewed by me and considered in my medical decision making (see chart for details).  Clinical Course      Visual Acuity  Right Eye Distance: 20/30 Left Eye Distance: 20/20 Bilateral Distance: 20/20  Right Eye Near:   Left Eye Near:    Bilateral Near:    acu   Pt is non-toxic appearing.  CT scan or orbits neg for orbital cellulitis.    Care plan discussed with Dr.  Delo.  Rx for Keflex, she agrees to warm compresses and lubricating drops if needed.  Return precautions given.    Final Clinical Impressions(s) / ED Diagnoses   Final diagnoses:  Preseptal cellulitis of right eye    New Prescriptions New Prescriptions   No medications on file     Pauline Aus, PA-C 08/28/15 1334    Geoffery Lyons, MD 08/28/15 1452

## 2015-08-28 NOTE — Discharge Instructions (Signed)
Warm compresses on/off to your eye.  You can use an OTC lubricating drop to your eye as needed. return here for any worsening symptoms such as increased swelling, fever, visual changes.

## 2016-01-12 ENCOUNTER — Encounter (HOSPITAL_COMMUNITY): Payer: Self-pay | Admitting: Emergency Medicine

## 2016-01-12 ENCOUNTER — Emergency Department (HOSPITAL_COMMUNITY)
Admission: EM | Admit: 2016-01-12 | Discharge: 2016-01-12 | Disposition: A | Discharge status: Home / Self Care | Payer: Self-pay | Attending: Emergency Medicine | Admitting: Emergency Medicine

## 2016-01-12 DIAGNOSIS — Z7982 Long term (current) use of aspirin: Secondary | ICD-10-CM | POA: Insufficient documentation

## 2016-01-12 DIAGNOSIS — N390 Urinary tract infection, site not specified: Secondary | ICD-10-CM | POA: Insufficient documentation

## 2016-01-12 DIAGNOSIS — F17210 Nicotine dependence, cigarettes, uncomplicated: Secondary | ICD-10-CM | POA: Insufficient documentation

## 2016-01-12 DIAGNOSIS — R51 Headache: Secondary | ICD-10-CM | POA: Insufficient documentation

## 2016-01-12 DIAGNOSIS — Z791 Long term (current) use of non-steroidal anti-inflammatories (NSAID): Secondary | ICD-10-CM | POA: Insufficient documentation

## 2016-01-12 DIAGNOSIS — E876 Hypokalemia: Secondary | ICD-10-CM | POA: Insufficient documentation

## 2016-01-12 DIAGNOSIS — R110 Nausea: Secondary | ICD-10-CM | POA: Insufficient documentation

## 2016-01-12 DIAGNOSIS — Z79899 Other long term (current) drug therapy: Secondary | ICD-10-CM | POA: Insufficient documentation

## 2016-01-12 DIAGNOSIS — N10 Acute tubulo-interstitial nephritis: Secondary | ICD-10-CM | POA: Insufficient documentation

## 2016-01-12 DIAGNOSIS — R109 Unspecified abdominal pain: Secondary | ICD-10-CM | POA: Insufficient documentation

## 2016-01-12 DIAGNOSIS — M545 Low back pain: Secondary | ICD-10-CM | POA: Insufficient documentation

## 2016-01-12 DIAGNOSIS — R509 Fever, unspecified: Secondary | ICD-10-CM | POA: Insufficient documentation

## 2016-01-12 LAB — URINALYSIS, ROUTINE W REFLEX MICROSCOPIC
Bilirubin Urine: NEGATIVE
GLUCOSE, UA: NEGATIVE mg/dL
Ketones, ur: NEGATIVE mg/dL
Nitrite: POSITIVE — AB
PH: 5 (ref 5.0–8.0)
Protein, ur: 100 mg/dL — AB
SPECIFIC GRAVITY, URINE: 1.014 (ref 1.005–1.030)

## 2016-01-12 LAB — COMPREHENSIVE METABOLIC PANEL
ALK PHOS: 78 U/L (ref 38–126)
ALT: 21 U/L (ref 14–54)
AST: 16 U/L (ref 15–41)
Albumin: 3.9 g/dL (ref 3.5–5.0)
Anion gap: 9 (ref 5–15)
BUN: 7 mg/dL (ref 6–20)
CALCIUM: 8.9 mg/dL (ref 8.9–10.3)
CO2: 25 mmol/L (ref 22–32)
CREATININE: 0.8 mg/dL (ref 0.44–1.00)
Chloride: 103 mmol/L (ref 101–111)
Glucose, Bld: 68 mg/dL (ref 65–99)
Potassium: 2.8 mmol/L — ABNORMAL LOW (ref 3.5–5.1)
Sodium: 137 mmol/L (ref 135–145)
TOTAL PROTEIN: 8 g/dL (ref 6.5–8.1)
Total Bilirubin: 0.6 mg/dL (ref 0.3–1.2)

## 2016-01-12 LAB — CBC WITH DIFFERENTIAL/PLATELET
Basophils Absolute: 0 10*3/uL (ref 0.0–0.1)
Basophils Relative: 0 %
EOS PCT: 0 %
Eosinophils Absolute: 0.1 10*3/uL (ref 0.0–0.7)
HCT: 43.9 % (ref 36.0–46.0)
HEMOGLOBIN: 14.7 g/dL (ref 12.0–15.0)
LYMPHS ABS: 2.9 10*3/uL (ref 0.7–4.0)
LYMPHS PCT: 19 %
MCH: 30.9 pg (ref 26.0–34.0)
MCHC: 33.5 g/dL (ref 30.0–36.0)
MCV: 92.4 fL (ref 78.0–100.0)
MONOS PCT: 7 %
Monocytes Absolute: 1 10*3/uL (ref 0.1–1.0)
Neutro Abs: 11 10*3/uL — ABNORMAL HIGH (ref 1.7–7.7)
Neutrophils Relative %: 74 %
Platelets: 177 10*3/uL (ref 150–400)
RBC: 4.75 MIL/uL (ref 3.87–5.11)
RDW: 13.1 % (ref 11.5–15.5)
WBC: 14.9 10*3/uL — AB (ref 4.0–10.5)

## 2016-01-12 MED ORDER — DEXTROSE 5 % IV SOLN
1.0000 g | Freq: Once | INTRAVENOUS | Status: AC
  Administered 2016-01-12: 1 g via INTRAVENOUS
  Filled 2016-01-12: qty 10

## 2016-01-12 MED ORDER — CEPHALEXIN 500 MG PO CAPS: 500.0000 mg | ORAL_CAPSULE | Freq: Four times a day (QID) | ORAL | 0 refills | Status: DC

## 2016-01-12 MED ORDER — SODIUM CHLORIDE 0.9 % IV BOLUS (SEPSIS): 1000.0000 mL | Freq: Once | INTRAVENOUS | Status: DC

## 2016-01-12 MED ORDER — KETOROLAC TROMETHAMINE 30 MG/ML IJ SOLN
30.0000 mg | Freq: Once | INTRAMUSCULAR | Status: AC
  Administered 2016-01-12: 30 mg via INTRAVENOUS
  Filled 2016-01-12: qty 1

## 2016-01-12 MED ORDER — SODIUM CHLORIDE 0.9 % IV BOLUS (SEPSIS)
1000.0000 mL | Freq: Once | INTRAVENOUS | Status: AC
  Administered 2016-01-12: 1000 mL via INTRAVENOUS

## 2016-01-12 MED ORDER — ACETAMINOPHEN 325 MG PO TABS
650.0000 mg | ORAL_TABLET | Freq: Once | ORAL | Status: DC
  Filled 2016-01-12: qty 2

## 2016-01-12 MED ORDER — HYDROCODONE-ACETAMINOPHEN 5-325 MG PO TABS: 1.0000 | ORAL_TABLET | Freq: Four times a day (QID) | ORAL | 0 refills | Status: DC | PRN

## 2016-01-12 MED ORDER — POTASSIUM CHLORIDE CRYS ER 20 MEQ PO TBCR
40.0000 meq | EXTENDED_RELEASE_TABLET | Freq: Once | ORAL | Status: AC
  Administered 2016-01-12: 40 meq via ORAL
  Filled 2016-01-12: qty 2

## 2016-01-12 MED ORDER — KETOROLAC TROMETHAMINE 30 MG/ML IJ SOLN
30.0000 mg | Freq: Once | INTRAMUSCULAR | Status: DC
  Filled 2016-01-12: qty 1
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
## 2016-01-12 NOTE — ED Triage Notes (Signed)
Pt was seen earlier today and left due to child care issues. C/o fever, chills, HA, weakness; reports she has a UTI.

## 2016-01-12 NOTE — ED Triage Notes (Signed)
Patient c/o fevers with body aches, chills,generalized weakness, and headache. Patient denies any vomiting or diarrhea but does state nausea. Patient states had UTI but did not get treated for it and is concerned it is now "in her blood stream." Per patient had ibuprofen this morning for fever.

## 2016-01-12 NOTE — ED Provider Notes (Signed)
[facility] Provider Note   CSN: [id number] Arrival date & time: 01/12/16  1155  By signing my name below, I, Soijett Blue, attest that this documentation has been prepared under the direction and in the presence of Bethann BerkshireJoseph Jaleigha Deane, MD. Electronically Signed: Soijett Blue, ED Scribe. 01/12/16. 12:28 PM.  History   Chief Complaint Chief Complaint  Patient presents with  . Fever    HPI Kelli Hoover is a 25 y.o. female who presents to the Emergency Department complaining of subjective fever onset 2 days ago. Pt notes that she has had an UTI in the past and would like to be evaluated for it today. She is having associated symptoms of chills, generalized body aches, HA, nausea, and lower back pain. She has tried ibuprofen with no relief for her symptoms. She denies cough, vomiting, diarrhea, and any other symptoms.     The history is provided by the patient. No language interpreter was used.  Fever   This is a new problem. The current episode started 2 days ago. The problem occurs rarely. The problem has not changed since onset.Her temperature was unmeasured prior to arrival. Associated symptoms include headaches. Pertinent negatives include no chest pain, no diarrhea, no vomiting, no congestion and no cough. She has tried ibuprofen for the symptoms. The treatment provided no relief.    Past Medical History:  Diagnosis Date  . Migraine   . Miscarriage   . Panic attack     Patient Active Problem List   Diagnosis Date Noted  . GAD (generalized anxiety disorder) 07/20/2014    Past Surgical History:  Procedure Laterality Date  . DILATION AND CURETTAGE OF UTERUS    . INDUCED ABORTION      OB History    Gravida Para Term Preterm AB Living   2             SAB TAB Ectopic Multiple Live Births                   Home Medications    Prior to Admission medications   Medication Sig Start Date End Date Taking? Authorizing Provider  Aspirin-Acetaminophen-Caffeine (GOODY  HEADACHE PO) Take 1 Package by mouth daily as needed (pain).    Historical Provider, MD  cephALEXin (KEFLEX) 500 MG capsule Take 1 capsule (500 mg total) by mouth 3 (three) times daily. 08/28/15   Tammy Triplett, PA-C  citalopram (CELEXA) 40 MG tablet Take 1 tablet (40 mg total) by mouth daily. Patient not taking: Reported on 08/28/2015 03/07/15   Mechele ClaudeWarren Stacks, MD  clonazePAM (KLONOPIN) 0.5 MG tablet Take 1 tablet (0.5 mg total) by mouth 2 (two) times daily. Patient not taking: Reported on 08/28/2015 02/07/15   Mechele ClaudeWarren Stacks, MD  ibuprofen (ADVIL,MOTRIN) 200 MG tablet Take 800 mg by mouth every 6 (six) hours as needed for moderate pain.    Historical Provider, MD  permethrin (ELIMITE) 5 % cream Apply to affected area once.  Repeat in 2 weeks if your symptoms return. Patient not taking: Reported on 08/28/2015 05/14/15   Burgess AmorJulie Idol, PA-C    Family History Family History  Problem Relation Age of Onset  . Hypertension Mother   . Anxiety disorder Mother   . Cancer Other   . Hypertension Other   . Anxiety disorder Sister   . Anxiety disorder Maternal Grandmother     Social History Social History  Substance Use Topics  . Smoking status: Current Every Day Smoker    Packs/day: 1.00  Years: 7.00    Types: Cigarettes  . Smokeless tobacco: Never Used  . Alcohol use Yes     Comment: occasionally     Allergies   Erythromycin   Review of Systems Review of Systems  Constitutional: Positive for chills and fever. Negative for appetite change and fatigue.  HENT: Negative for congestion, ear discharge and sinus pressure.   Eyes: Negative for discharge.  Respiratory: Negative for cough.   Cardiovascular: Negative for chest pain.  Gastrointestinal: Positive for nausea. Negative for abdominal pain, diarrhea and vomiting.  Genitourinary: Negative for frequency and hematuria.  Musculoskeletal: Positive for back pain (lower) and myalgias (generalized).  Skin: Negative for rash.  Neurological:  Positive for headaches. Negative for seizures.  Psychiatric/Behavioral: Negative for hallucinations.    Physical Exam Updated Vital Signs BP 129/97 (BP Location: Left Arm)   Pulse 110   Temp 99.7 F (37.6 C) (Oral)   Resp 20   Ht 5' (1.524 m)   Wt 112 lb (50.8 kg)   LMP 01/11/2016   SpO2 98%   BMI 21.87 kg/m   Physical Exam  Constitutional: She is oriented to person, place, and time. She appears well-developed.  HENT:  Head: Normocephalic.  Eyes: Conjunctivae and EOM are normal. No scleral icterus.  Neck: Neck supple. No thyromegaly present.  Cardiovascular: Normal rate and regular rhythm.  Exam reveals no gallop and no friction rub.   No murmur heard. Pulmonary/Chest: No stridor. She has no wheezes. She has no rales. She exhibits no tenderness.  Abdominal: She exhibits no distension. There is no tenderness. There is no rebound.  Minor right flank tenderness.   Musculoskeletal: Normal range of motion. She exhibits no edema.  Lymphadenopathy:    She has no cervical adenopathy.  Neurological: She is oriented to person, place, and time. She exhibits normal muscle tone. Coordination normal.  Skin: No rash noted. No erythema.  Psychiatric: She has a normal mood and affect. Her behavior is normal.  Nursing note and vitals reviewed.    ED Treatments / Results  DIAGNOSTIC STUDIES: Oxygen Saturation is 98% on RA, nl by my interpretation.    COORDINATION OF CARE: 12:26 PM Discussed treatment plan with pt at bedside which includes tylenol, fluids, labs, UA, and toradol injection, and pt agreed to plan.   Labs (all labs ordered are listed, but only abnormal results are displayed) Labs Reviewed - No data to display   Radiology No results found.  Procedures Procedures (including critical care time)  Medications Ordered in ED Medications - No data to display   Initial Impression / Assessment and Plan / ED Course  I have reviewed the triage vital signs and the nursing  notes.  Pertinent labs & imaging results that were available during my care of the patient were reviewed by me and considered in my medical decision making (see chart for details).  Clinical Course     Pt with diarrhea.  She left because of child care issues and then returned later in the day for continued care  Final Clinical Impressions(s) / ED Diagnoses   Final diagnoses:  None    New Prescriptions New Prescriptions   No medications on file   The chart was scribed for me under my direct supervision.  I personally performed the history, physical, and medical decision making and all procedures in the evaluation of this patient.Bethann Berkshire.     Ewell Benassi, MD 01/24/16 1125

## 2016-01-12 NOTE — Discharge Instructions (Signed)
Drink plenty of fluids. Follow-up with her doctor after he finished the antibiotics. Return to the emergency department for persistent fever or vomiting

## 2016-01-12 NOTE — ED Provider Notes (Signed)
[facility] Provider Note   CSN: [id number] Arrival date & time: 01/12/16  1440     History   Chief Complaint Chief Complaint  Patient presents with  . Fever    HPI Kelli Hoover is a 25 y.o. female.  Patient complains of back pain with dysuria   The history is provided by the patient. No language interpreter was used.  Fever   This is a new problem. The current episode started more than 2 days ago. The problem occurs constantly. The problem has not changed since onset.Her temperature was unmeasured prior to arrival. Pertinent negatives include no chest pain, no diarrhea, no congestion, no headaches and no cough.    Past Medical History:  Diagnosis Date  . Migraine   . Miscarriage   . Panic attack     Patient Active Problem List   Diagnosis Date Noted  . GAD (generalized anxiety disorder) 07/20/2014    Past Surgical History:  Procedure Laterality Date  . DILATION AND CURETTAGE OF UTERUS    . INDUCED ABORTION      OB History    Gravida Para Term Preterm AB Living   2             SAB TAB Ectopic Multiple Live Births                   Home Medications    Prior to Admission medications   Medication Sig Start Date End Date Taking? Authorizing Provider  Aspirin-Acetaminophen-Caffeine (GOODY HEADACHE PO) Take 1 Package by mouth daily as needed (pain).   Yes Historical Provider, MD  ibuprofen (ADVIL,MOTRIN) 200 MG tablet Take 800 mg by mouth every 6 (six) hours as needed for moderate pain.   Yes Historical Provider, MD  cephALEXin (KEFLEX) 500 MG capsule Take 1 capsule (500 mg total) by mouth 4 (four) times daily. 01/12/16   Bethann Berkshire, MD  citalopram (CELEXA) 40 MG tablet Take 1 tablet (40 mg total) by mouth daily. Patient not taking: Reported on 01/12/2016 03/07/15   Mechele Claude, MD  clonazePAM (KLONOPIN) 0.5 MG tablet Take 1 tablet (0.5 mg total) by mouth 2 (two) times daily. Patient not taking: Reported on 01/12/2016 02/07/15   Mechele Claude, MD   HYDROcodone-acetaminophen (NORCO/VICODIN) 5-325 MG tablet Take 1 tablet by mouth every 6 (six) hours as needed for moderate pain. 01/12/16   Bethann Berkshire, MD    Family History Family History  Problem Relation Age of Onset  . Hypertension Mother   . Anxiety disorder Mother   . Cancer Other   . Hypertension Other   . Anxiety disorder Sister   . Anxiety disorder Maternal Grandmother     Social History Social History  Substance Use Topics  . Smoking status: Current Every Day Smoker    Packs/day: 1.00    Years: 7.00    Types: Cigarettes  . Smokeless tobacco: Never Used  . Alcohol use Yes     Comment: occasionally     Allergies   Erythromycin   Review of Systems Review of Systems  Constitutional: Positive for fever. Negative for appetite change and fatigue.  HENT: Negative for congestion, ear discharge and sinus pressure.   Eyes: Negative for discharge.  Respiratory: Negative for cough.   Cardiovascular: Negative for chest pain.  Gastrointestinal: Negative for abdominal pain and diarrhea.  Genitourinary: Positive for dysuria and frequency. Negative for hematuria.  Musculoskeletal: Positive for back pain.  Skin: Negative for rash.  Neurological: Negative for seizures and headaches.  Psychiatric/Behavioral: Negative for hallucinations.     Physical Exam Updated Vital Signs BP 112/75 (BP Location: Left Arm)   Pulse 105   Temp 99.1 F (37.3 C) (Oral)   Resp 18   Ht 5' (1.524 m)   Wt 112 lb (50.8 kg)   LMP 01/11/2016   SpO2 99%   BMI 21.87 kg/m   Physical Exam  Constitutional: She is oriented to person, place, and time. She appears well-developed.  HENT:  Head: Normocephalic.  Eyes: Conjunctivae and EOM are normal. No scleral icterus.  Neck: Neck supple. No thyromegaly present.  Cardiovascular: Normal rate and regular rhythm.  Exam reveals no gallop and no friction rub.   No murmur heard. Pulmonary/Chest: No stridor. She has no wheezes. She has no rales.  She exhibits no tenderness.  Abdominal: She exhibits no distension. There is no tenderness. There is no rebound.  Genitourinary:  Genitourinary Comments: Bilateral moderate flank pain and tenderness  Musculoskeletal: Normal range of motion. She exhibits no edema.  Lymphadenopathy:    She has no cervical adenopathy.  Neurological: She is oriented to person, place, and time. She exhibits normal muscle tone. Coordination normal.  Skin: No rash noted. No erythema.  Psychiatric: She has a normal mood and affect. Her behavior is normal.     ED Treatments / Results  Labs (all labs ordered are listed, but only abnormal results are displayed) Labs Reviewed  CBC WITH DIFFERENTIAL/PLATELET - Abnormal; Notable for the following:       Result Value   WBC 14.9 (*)    Neutro Abs 11.0 (*)    All other components within normal limits  COMPREHENSIVE METABOLIC PANEL - Abnormal; Notable for the following:    Potassium 2.8 (*)    All other components within normal limits  URINALYSIS, ROUTINE W REFLEX MICROSCOPIC - Abnormal; Notable for the following:    APPearance HAZY (*)    Hgb urine dipstick SMALL (*)    Protein, ur 100 (*)    Nitrite POSITIVE (*)    Leukocytes, UA MODERATE (*)    Bacteria, UA MANY (*)    All other components within normal limits  URINE CULTURE    EKG  EKG Interpretation None       Radiology No results found.  Procedures Procedures (including critical care time)  Medications Ordered in ED Medications  potassium chloride SA (K-DUR,KLOR-CON) CR tablet 40 mEq (not administered)  sodium chloride 0.9 % bolus 1,000 mL (0 mLs Intravenous Stopped 01/12/16 1640)  ketorolac (TORADOL) 30 MG/ML injection 30 mg (30 mg Intravenous Given 01/12/16 1522)  cefTRIAXone (ROCEPHIN) 1 g in dextrose 5 % 50 mL IVPB (1 g Intravenous New Bag/Given 01/12/16 1641)     Initial Impression / Assessment and Plan / ED Course  I have reviewed the triage vital signs and the nursing  notes.  Pertinent labs & imaging results that were available during my care of the patient were reviewed by me and considered in my medical decision making (see chart for details).  Clinical Course     Patient with urinary tract infection and hypokalemia. She'll be treated with Keflex and will follow-up after she finished the antibiotic  Final Clinical Impressions(s) / ED Diagnoses   Final diagnoses:  Acute pyelonephritis    New Prescriptions New Prescriptions   CEPHALEXIN (KEFLEX) 500 MG CAPSULE    Take 1 capsule (500 mg total) by mouth 4 (four) times daily.   HYDROCODONE-ACETAMINOPHEN (NORCO/VICODIN) 5-325 MG TABLET    Take 1 tablet by  mouth every 6 (six) hours as needed for moderate pain.     Bethann BerkshireJoseph Earnesteen Birnie, MD 01/12/16 [phone]

## 2016-01-15 LAB — URINE CULTURE: Culture: >=100000 — AB

## 2016-07-16 ENCOUNTER — Other Ambulatory Visit: Payer: Self-pay | Admitting: General Practice

## 2016-07-16 NOTE — Telephone Encounter (Signed)
Patient informed she will need to be seen before medication refills will be given.

## 2017-05-20 ENCOUNTER — Encounter (HOSPITAL_COMMUNITY): Payer: Self-pay | Admitting: Emergency Medicine

## 2017-05-20 ENCOUNTER — Emergency Department (HOSPITAL_COMMUNITY)
Admission: EM | Admit: 2017-05-20 | Discharge: 2017-05-20 | Disposition: A | Discharge status: Home / Self Care | Payer: Self-pay | Attending: Emergency Medicine | Admitting: Emergency Medicine

## 2017-05-20 ENCOUNTER — Other Ambulatory Visit: Payer: Self-pay

## 2017-05-20 ENCOUNTER — Emergency Department (HOSPITAL_COMMUNITY): Payer: Self-pay

## 2017-05-20 DIAGNOSIS — F17210 Nicotine dependence, cigarettes, uncomplicated: Secondary | ICD-10-CM | POA: Insufficient documentation

## 2017-05-20 DIAGNOSIS — S0990XA Unspecified injury of head, initial encounter: Secondary | ICD-10-CM | POA: Insufficient documentation

## 2017-05-20 DIAGNOSIS — R58 Hemorrhage, not elsewhere classified: Secondary | ICD-10-CM | POA: Insufficient documentation

## 2017-05-20 DIAGNOSIS — Z79899 Other long term (current) drug therapy: Secondary | ICD-10-CM | POA: Insufficient documentation

## 2017-05-20 DIAGNOSIS — Y929 Unspecified place or not applicable: Secondary | ICD-10-CM | POA: Insufficient documentation

## 2017-05-20 DIAGNOSIS — Y999 Unspecified external cause status: Secondary | ICD-10-CM | POA: Insufficient documentation

## 2017-05-20 DIAGNOSIS — M542 Cervicalgia: Secondary | ICD-10-CM | POA: Insufficient documentation

## 2017-05-20 DIAGNOSIS — Y939 Activity, unspecified: Secondary | ICD-10-CM | POA: Insufficient documentation

## 2017-05-20 MED ORDER — ACETAMINOPHEN 500 MG PO TABS
1000.0000 mg | ORAL_TABLET | Freq: Once | ORAL | Status: AC
  Administered 2017-05-20: 1000 mg via ORAL
  Filled 2017-05-20: qty 2

## 2017-05-20 MED ORDER — IBUPROFEN 800 MG PO TABS
ORAL_TABLET | ORAL | Status: AC
  Administered 2017-05-20: 800 mg
  Filled 2017-05-20: qty 1
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
## 2017-05-20 NOTE — Discharge Instructions (Addendum)
X-rays showed no broken bones.  Ice pack.  Alternate Tylenol with ibuprofen.  You will be sore for several days.

## 2017-07-24 ENCOUNTER — Emergency Department (HOSPITAL_COMMUNITY): Payer: Self-pay

## 2017-07-24 ENCOUNTER — Emergency Department (HOSPITAL_COMMUNITY)
Admission: EM | Admit: 2017-07-24 | Discharge: 2017-07-24 | Disposition: A | Discharge status: Home / Self Care | Payer: Self-pay | Attending: Emergency Medicine | Admitting: Emergency Medicine

## 2017-07-24 ENCOUNTER — Encounter (HOSPITAL_COMMUNITY): Payer: Self-pay | Admitting: *Deleted

## 2017-07-24 DIAGNOSIS — Y998 Other external cause status: Secondary | ICD-10-CM | POA: Insufficient documentation

## 2017-07-24 DIAGNOSIS — Y939 Activity, unspecified: Secondary | ICD-10-CM | POA: Insufficient documentation

## 2017-07-24 DIAGNOSIS — F17210 Nicotine dependence, cigarettes, uncomplicated: Secondary | ICD-10-CM | POA: Insufficient documentation

## 2017-07-24 DIAGNOSIS — R51 Headache: Secondary | ICD-10-CM | POA: Insufficient documentation

## 2017-07-24 DIAGNOSIS — S01511A Laceration without foreign body of lip, initial encounter: Secondary | ICD-10-CM | POA: Insufficient documentation

## 2017-07-24 DIAGNOSIS — Y929 Unspecified place or not applicable: Secondary | ICD-10-CM | POA: Insufficient documentation

## 2017-07-24 LAB — I-STAT BETA HCG BLOOD, ED (MC, WL, AP ONLY): I-stat hCG, quantitative: <5 m[IU]/mL (ref <5)

## 2017-07-24 MED ORDER — POVIDONE-IODINE 10 % EX SOLN
CUTANEOUS | Status: AC
  Administered 2017-07-24: 23:00:00
  Filled 2017-07-24: qty 15

## 2017-07-24 MED ORDER — LIDOCAINE-EPINEPHRINE-TETRACAINE (LET) SOLUTION
3.0000 mL | Freq: Once | NASAL | Status: AC
  Administered 2017-07-24: 3 mL via TOPICAL

## 2017-07-24 MED ORDER — TRAMADOL HCL 50 MG PO TABS: 50.0000 mg | ORAL_TABLET | Freq: Four times a day (QID) | ORAL | 0 refills | Status: DC | PRN

## 2017-07-24 MED ORDER — IBUPROFEN 800 MG PO TABS
ORAL_TABLET | ORAL | Status: AC
  Administered 2017-07-24: 800 mg via ORAL
  Filled 2017-07-24: qty 1

## 2017-07-24 MED ORDER — LIDOCAINE-EPINEPHRINE (PF) 2 %-1:200000 IJ SOLN
20.0000 mL | Freq: Once | INTRAMUSCULAR | Status: AC
  Administered 2017-07-24: 20 mL

## 2017-07-24 MED ORDER — LIDOCAINE-EPINEPHRINE (PF) 2 %-1:200000 IJ SOLN
INTRAMUSCULAR | Status: AC
  Administered 2017-07-24: 20 mL
  Filled 2017-07-24: qty 20

## 2017-07-24 MED ORDER — LIDOCAINE-EPINEPHRINE-TETRACAINE (LET) SOLUTION
NASAL | Status: AC
  Administered 2017-07-24: 22:00:00 3 mL via TOPICAL
  Filled 2017-07-24: qty 3

## 2017-07-24 MED ORDER — IBUPROFEN 800 MG PO TABS
800.0000 mg | ORAL_TABLET | Freq: Once | ORAL | Status: AC
  Administered 2017-07-24: 800 mg via ORAL

## 2017-07-24 MED ORDER — CEPHALEXIN 500 MG PO CAPS: 500.0000 mg | ORAL_CAPSULE | Freq: Three times a day (TID) | ORAL | 0 refills | Status: DC
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
## 2017-07-24 NOTE — ED Notes (Signed)
Pt asleep.
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
## 2017-07-24 NOTE — Discharge Instructions (Addendum)
Follow-up with Dr. Newell CoralNudelman in 1 to 2 weeks.
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
## 2017-07-24 NOTE — ED Notes (Signed)
RN attempted to clean dried blood from lip injury. Some dried blood removed however pt felt too much discomfort to continue.

## 2018-01-02 ENCOUNTER — Emergency Department (HOSPITAL_COMMUNITY): Admission: EM | Admit: 2018-01-02 | Discharge: 2018-01-02 | Discharge status: Against Medical Advice | Payer: Self-pay

## 2018-07-13 ENCOUNTER — Encounter (HOSPITAL_COMMUNITY)
Admission: EM | Disposition: A | Discharge status: Home / Self Care | Payer: Self-pay | Source: Home / Self Care | Attending: Emergency Medicine

## 2018-07-13 ENCOUNTER — Observation Stay (HOSPITAL_COMMUNITY)
Admission: EM | Admit: 2018-07-13 | Discharge: 2018-07-14 | Disposition: A | Discharge status: Home / Self Care | Payer: Self-pay | Attending: General Surgery | Admitting: General Surgery

## 2018-07-13 ENCOUNTER — Other Ambulatory Visit: Payer: Self-pay

## 2018-07-13 ENCOUNTER — Encounter (HOSPITAL_COMMUNITY): Payer: Self-pay | Admitting: Emergency Medicine

## 2018-07-13 ENCOUNTER — Emergency Department (HOSPITAL_COMMUNITY): Payer: Self-pay

## 2018-07-13 ENCOUNTER — Emergency Department (HOSPITAL_COMMUNITY): Payer: Self-pay | Admitting: Anesthesiology

## 2018-07-13 DIAGNOSIS — F17210 Nicotine dependence, cigarettes, uncomplicated: Secondary | ICD-10-CM | POA: Insufficient documentation

## 2018-07-13 DIAGNOSIS — E876 Hypokalemia: Secondary | ICD-10-CM | POA: Insufficient documentation

## 2018-07-13 DIAGNOSIS — K3580 Unspecified acute appendicitis: Principal | ICD-10-CM

## 2018-07-13 DIAGNOSIS — Z881 Allergy status to other antibiotic agents status: Secondary | ICD-10-CM | POA: Insufficient documentation

## 2018-07-13 DIAGNOSIS — Z1159 Encounter for screening for other viral diseases: Secondary | ICD-10-CM | POA: Insufficient documentation

## 2018-07-13 HISTORY — PX: LAPAROSCOPIC APPENDECTOMY: SHX408

## 2018-07-13 LAB — CBC WITH DIFFERENTIAL/PLATELET
Abs Immature Granulocytes: 0.04 10*3/uL (ref 0.00–0.07)
Basophils Absolute: 0 10*3/uL (ref 0.0–0.1)
Basophils Relative: 0 %
Eosinophils Absolute: 0.1 10*3/uL (ref 0.0–0.5)
Eosinophils Relative: 1 %
HCT: 47.6 % — ABNORMAL HIGH (ref 36.0–46.0)
Hemoglobin: 15.8 g/dL — ABNORMAL HIGH (ref 12.0–15.0)
Immature Granulocytes: 0 %
Lymphocytes Relative: 27 %
Lymphs Abs: 2.6 10*3/uL (ref 0.7–4.0)
MCH: 29.3 pg (ref 26.0–34.0)
MCHC: 33.2 g/dL (ref 30.0–36.0)
MCV: 88.3 fL (ref 80.0–100.0)
Monocytes Absolute: 0.3 10*3/uL (ref 0.1–1.0)
Monocytes Relative: 3 %
Neutro Abs: 6.7 10*3/uL (ref 1.7–7.7)
Neutrophils Relative %: 69 %
Platelets: 258 10*3/uL (ref 150–400)
RBC: 5.39 MIL/uL — ABNORMAL HIGH (ref 3.87–5.11)
RDW: 13.6 % (ref 11.5–15.5)
WBC: 9.8 10*3/uL (ref 4.0–10.5)
nRBC: 0 % (ref 0.0–0.2)

## 2018-07-13 LAB — URINALYSIS, ROUTINE W REFLEX MICROSCOPIC
Bacteria, UA: NONE SEEN
Bilirubin Urine: NEGATIVE
Glucose, UA: NEGATIVE mg/dL
Ketones, ur: NEGATIVE mg/dL
Leukocytes,Ua: NEGATIVE
Nitrite: NEGATIVE
Protein, ur: NEGATIVE mg/dL
Specific Gravity, Urine: 1.013 (ref 1.005–1.030)
pH: 8 (ref 5.0–8.0)

## 2018-07-13 LAB — COMPREHENSIVE METABOLIC PANEL
ALT: 9 U/L (ref 0–44)
AST: 12 U/L — ABNORMAL LOW (ref 15–41)
Albumin: 4.4 g/dL (ref 3.5–5.0)
Alkaline Phosphatase: 89 U/L (ref 38–126)
Anion gap: 11 (ref 5–15)
BUN: 5 mg/dL — ABNORMAL LOW (ref 6–20)
CO2: 23 mmol/L (ref 22–32)
Calcium: 9.2 mg/dL (ref 8.9–10.3)
Chloride: 101 mmol/L (ref 98–111)
Creatinine, Ser: 0.59 mg/dL (ref 0.44–1.00)
GFR calc Af Amer: >60 mL/min (ref >60)
GFR calc non Af Amer: >60 mL/min (ref >60)
Glucose, Bld: 108 mg/dL — ABNORMAL HIGH (ref 70–99)
Potassium: 3.2 mmol/L — ABNORMAL LOW (ref 3.5–5.1)
Sodium: 135 mmol/L (ref 135–145)
Total Bilirubin: 0.7 mg/dL (ref 0.3–1.2)
Total Protein: 8.1 g/dL (ref 6.5–8.1)

## 2018-07-13 LAB — I-STAT BETA HCG BLOOD, ED (MC, WL, AP ONLY): I-stat hCG, quantitative: <5 m[IU]/mL (ref <5)

## 2018-07-13 LAB — LIPASE, BLOOD: Lipase: 32 U/L (ref 11–51)

## 2018-07-13 LAB — SARS CORONAVIRUS 2 BY RT PCR (HOSPITAL ORDER, PERFORMED IN ~~LOC~~ HOSPITAL LAB): SARS Coronavirus 2: NEGATIVE

## 2018-07-13 SURGERY — APPENDECTOMY, LAPAROSCOPIC
Anesthesia: General

## 2018-07-13 MED ORDER — HYDROMORPHONE HCL 1 MG/ML IJ SOLN
1.0000 mg | Freq: Once | INTRAMUSCULAR | Status: AC
  Administered 2018-07-13: 1 mg via INTRAVENOUS
  Filled 2018-07-13: qty 1

## 2018-07-13 MED ORDER — ONDANSETRON HCL 4 MG/2ML IJ SOLN: 4.0000 mg | Freq: Four times a day (QID) | INTRAMUSCULAR | Status: DC | PRN

## 2018-07-13 MED ORDER — OXYCODONE-ACETAMINOPHEN 5-325 MG PO TABS
1.0000 | ORAL_TABLET | ORAL | Status: DC | PRN
  Administered 2018-07-13 – 2018-07-14 (×2): 1 via ORAL
  Filled 2018-07-13: qty 2
  Filled 2018-07-13: qty 1

## 2018-07-13 MED ORDER — BUPIVACAINE LIPOSOME 1.3 % IJ SUSP
INTRAMUSCULAR | Status: AC
  Filled 2018-07-13: qty 20

## 2018-07-13 MED ORDER — SODIUM CHLORIDE 0.9 % IV SOLN
INTRAVENOUS | Status: DC
  Administered 2018-07-13: 17:00:00 via INTRAVENOUS

## 2018-07-13 MED ORDER — PIPERACILLIN-TAZOBACTAM 3.375 G IVPB
3.3750 g | Freq: Three times a day (TID) | INTRAVENOUS | Status: DC
  Administered 2018-07-13 – 2018-07-14 (×3): 3.375 g via INTRAVENOUS
  Filled 2018-07-13 (×2): qty 50

## 2018-07-13 MED ORDER — MORPHINE SULFATE (PF) 4 MG/ML IV SOLN
4.0000 mg | Freq: Once | INTRAVENOUS | Status: AC
  Administered 2018-07-13: 4 mg via INTRAVENOUS
  Filled 2018-07-13: qty 1

## 2018-07-13 MED ORDER — KETOROLAC TROMETHAMINE 30 MG/ML IJ SOLN: 30.0000 mg | Freq: Four times a day (QID) | INTRAMUSCULAR | Status: DC | PRN

## 2018-07-13 MED ORDER — ONDANSETRON HCL 4 MG/2ML IJ SOLN
4.0000 mg | Freq: Once | INTRAMUSCULAR | Status: AC
  Administered 2018-07-13: 4 mg via INTRAVENOUS
  Filled 2018-07-13: qty 2

## 2018-07-13 MED ORDER — MIDAZOLAM HCL 2 MG/2ML IJ SOLN
INTRAMUSCULAR | Status: AC
  Filled 2018-07-13: qty 2

## 2018-07-13 MED ORDER — ENOXAPARIN SODIUM 40 MG/0.4ML ~~LOC~~ SOLN
40.0000 mg | SUBCUTANEOUS | Status: DC
  Filled 2018-07-13: qty 0.4

## 2018-07-13 MED ORDER — ONDANSETRON HCL 4 MG/2ML IJ SOLN
INTRAMUSCULAR | Status: DC | PRN
  Administered 2018-07-13: 4 mg via INTRAVENOUS

## 2018-07-13 MED ORDER — ACETAMINOPHEN 325 MG PO TABS: 650.0000 mg | ORAL_TABLET | Freq: Four times a day (QID) | ORAL | Status: DC | PRN

## 2018-07-13 MED ORDER — BUPIVACAINE LIPOSOME 1.3 % IJ SUSP
INTRAMUSCULAR | Status: DC | PRN
  Administered 2018-07-13: 20 mL

## 2018-07-13 MED ORDER — ONDANSETRON HCL 4 MG/2ML IJ SOLN
INTRAMUSCULAR | Status: AC
  Filled 2018-07-13: qty 2

## 2018-07-13 MED ORDER — SODIUM CHLORIDE 0.9 % IR SOLN
Status: DC | PRN
  Administered 2018-07-13: 1000 mL

## 2018-07-13 MED ORDER — SUCCINYLCHOLINE CHLORIDE 200 MG/10ML IV SOSY
PREFILLED_SYRINGE | INTRAVENOUS | Status: AC
  Filled 2018-07-13: qty 10

## 2018-07-13 MED ORDER — KETOROLAC TROMETHAMINE 30 MG/ML IJ SOLN
30.0000 mg | Freq: Four times a day (QID) | INTRAMUSCULAR | Status: AC
  Administered 2018-07-13: 30 mg via INTRAVENOUS
  Filled 2018-07-13: qty 1

## 2018-07-13 MED ORDER — CHLORHEXIDINE GLUCONATE CLOTH 2 % EX PADS: 6.0000 | MEDICATED_PAD | Freq: Once | CUTANEOUS | Status: DC

## 2018-07-13 MED ORDER — LORAZEPAM 2 MG/ML IJ SOLN: 1.0000 mg | INTRAMUSCULAR | Status: DC | PRN

## 2018-07-13 MED ORDER — IOHEXOL 300 MG/ML  SOLN
100.0000 mL | Freq: Once | INTRAMUSCULAR | Status: AC | PRN
  Administered 2018-07-13: 100 mL via INTRAVENOUS

## 2018-07-13 MED ORDER — PROPOFOL 10 MG/ML IV BOLUS
INTRAVENOUS | Status: DC | PRN
  Administered 2018-07-13: 150 mg via INTRAVENOUS

## 2018-07-13 MED ORDER — SODIUM CHLORIDE 0.9 % IV BOLUS
1000.0000 mL | Freq: Once | INTRAVENOUS | Status: AC
  Administered 2018-07-13: 1000 mL via INTRAVENOUS

## 2018-07-13 MED ORDER — PIPERACILLIN-TAZOBACTAM 3.375 G IVPB 30 MIN
3.3750 g | Freq: Once | INTRAVENOUS | Status: AC
  Administered 2018-07-13: 3.375 g via INTRAVENOUS
  Filled 2018-07-13: qty 50

## 2018-07-13 MED ORDER — MEPERIDINE HCL 50 MG/ML IJ SOLN: 6.2500 mg | INTRAMUSCULAR | Status: DC | PRN

## 2018-07-13 MED ORDER — SUGAMMADEX SODIUM 500 MG/5ML IV SOLN
INTRAVENOUS | Status: AC
  Filled 2018-07-13: qty 5

## 2018-07-13 MED ORDER — ONDANSETRON 4 MG PO TBDP: 4.0000 mg | ORAL_TABLET | Freq: Four times a day (QID) | ORAL | Status: DC | PRN

## 2018-07-13 MED ORDER — HYDROMORPHONE HCL 1 MG/ML IJ SOLN
1.0000 mg | INTRAMUSCULAR | Status: DC | PRN
  Administered 2018-07-13 – 2018-07-14 (×2): 1 mg via INTRAVENOUS
  Filled 2018-07-13 (×2): qty 1

## 2018-07-13 MED ORDER — SUGAMMADEX SODIUM 200 MG/2ML IV SOLN
INTRAVENOUS | Status: DC | PRN
  Administered 2018-07-13: 100 mg via INTRAVENOUS
  Administered 2018-07-13: 200 mg via INTRAVENOUS

## 2018-07-13 MED ORDER — LACTATED RINGERS IV SOLN
INTRAVENOUS | Status: DC
  Administered 2018-07-13: 1000 mL via INTRAVENOUS

## 2018-07-13 MED ORDER — FENTANYL CITRATE (PF) 250 MCG/5ML IJ SOLN
INTRAMUSCULAR | Status: AC
  Filled 2018-07-13: qty 5

## 2018-07-13 MED ORDER — SODIUM CHLORIDE 0.9 % IV SOLN: 2.0000 g | Freq: Once | INTRAVENOUS | Status: DC

## 2018-07-13 MED ORDER — SIMETHICONE 80 MG PO CHEW: 40.0000 mg | CHEWABLE_TABLET | Freq: Four times a day (QID) | ORAL | Status: DC | PRN

## 2018-07-13 MED ORDER — DIPHENHYDRAMINE HCL 25 MG PO CAPS: 25.0000 mg | ORAL_CAPSULE | Freq: Four times a day (QID) | ORAL | Status: DC | PRN

## 2018-07-13 MED ORDER — MIDAZOLAM HCL 5 MG/5ML IJ SOLN
INTRAMUSCULAR | Status: DC | PRN
  Administered 2018-07-13: 2 mg via INTRAVENOUS

## 2018-07-13 MED ORDER — METRONIDAZOLE IN NACL 5-0.79 MG/ML-% IV SOLN
500.0000 mg | Freq: Once | INTRAVENOUS | Status: DC
  Filled 2018-07-13: qty 100

## 2018-07-13 MED ORDER — ROCURONIUM BROMIDE 100 MG/10ML IV SOLN
INTRAVENOUS | Status: DC | PRN
  Administered 2018-07-13: 10 mg via INTRAVENOUS
  Administered 2018-07-13: 20 mg via INTRAVENOUS

## 2018-07-13 MED ORDER — LACTATED RINGERS IV SOLN: INTRAVENOUS | Status: DC

## 2018-07-13 MED ORDER — FENTANYL CITRATE (PF) 100 MCG/2ML IJ SOLN
INTRAMUSCULAR | Status: DC | PRN
  Administered 2018-07-13: 100 ug via INTRAVENOUS
  Administered 2018-07-13 (×3): 50 ug via INTRAVENOUS

## 2018-07-13 MED ORDER — DIPHENHYDRAMINE HCL 50 MG/ML IJ SOLN: 25.0000 mg | Freq: Four times a day (QID) | INTRAMUSCULAR | Status: DC | PRN

## 2018-07-13 MED ORDER — ACETAMINOPHEN 650 MG RE SUPP: 650.0000 mg | Freq: Four times a day (QID) | RECTAL | Status: DC | PRN

## 2018-07-13 MED ORDER — PROMETHAZINE HCL 25 MG/ML IJ SOLN: 6.2500 mg | INTRAMUSCULAR | Status: DC | PRN

## 2018-07-13 MED ORDER — SUCCINYLCHOLINE CHLORIDE 20 MG/ML IJ SOLN
INTRAMUSCULAR | Status: DC | PRN
  Administered 2018-07-13: 100 mg via INTRAVENOUS

## 2018-07-13 MED ORDER — HYDROMORPHONE HCL 1 MG/ML IJ SOLN: 0.2500 mg | INTRAMUSCULAR | Status: DC | PRN

## 2018-07-13 MED ORDER — HYDROCODONE-ACETAMINOPHEN 7.5-325 MG PO TABS: 1.0000 | ORAL_TABLET | Freq: Once | ORAL | Status: DC | PRN

## 2018-07-13 MED ORDER — ROCURONIUM BROMIDE 10 MG/ML (PF) SYRINGE
PREFILLED_SYRINGE | INTRAVENOUS | Status: AC
  Filled 2018-07-13: qty 10

## 2018-07-13 MED ORDER — NICOTINE 21 MG/24HR TD PT24
21.0000 mg | MEDICATED_PATCH | Freq: Every day | TRANSDERMAL | Status: DC
  Administered 2018-07-13 – 2018-07-14 (×2): 21 mg via TRANSDERMAL
  Filled 2018-07-13 (×2): qty 1

## 2018-07-13 SURGICAL SUPPLY — 50 items
ADH SKN CLS APL DERMABOND .7 (GAUZE/BANDAGES/DRESSINGS) ×1
APL PRP STRL LF DISP 70% ISPRP (MISCELLANEOUS) ×1
BAG RETRIEVAL 10 (BASKET) ×1
BAG RETRIEVAL 10MM (BASKET) ×1
CHLORAPREP W/TINT 26 (MISCELLANEOUS) ×3 IMPLANT
CLOTH BEACON ORANGE TIMEOUT ST (SAFETY) ×3 IMPLANT
COVER LIGHT HANDLE STERIS (MISCELLANEOUS) ×6 IMPLANT
CUTTER FLEX LINEAR 45M (STAPLE) ×3 IMPLANT
DECANTER SPIKE VIAL GLASS SM (MISCELLANEOUS) ×3 IMPLANT
DERMABOND ADVANCED (GAUZE/BANDAGES/DRESSINGS) ×2
DERMABOND ADVANCED .7 DNX12 (GAUZE/BANDAGES/DRESSINGS) IMPLANT
ELECT REM PT RETURN 9FT ADLT (ELECTROSURGICAL) ×3
ELECTRODE REM PT RTRN 9FT ADLT (ELECTROSURGICAL) ×1 IMPLANT
EVACUATOR SMOKE 8.L (FILTER) ×3 IMPLANT
GLOVE BIOGEL PI IND STRL 7.0 (GLOVE) ×2 IMPLANT
GLOVE BIOGEL PI INDICATOR 7.0 (GLOVE) ×6
GLOVE SURG SS PI 7.5 STRL IVOR (GLOVE) ×3 IMPLANT
GOWN STRL REUS W/ TWL XL LVL3 (GOWN DISPOSABLE) ×1 IMPLANT
GOWN STRL REUS W/TWL LRG LVL3 (GOWN DISPOSABLE) ×3 IMPLANT
GOWN STRL REUS W/TWL XL LVL3 (GOWN DISPOSABLE) ×3
INST SET LAPROSCOPIC AP (KITS) ×3 IMPLANT
KIT TURNOVER KIT A (KITS) ×3 IMPLANT
MANIFOLD NEPTUNE II (INSTRUMENTS) ×3 IMPLANT
NDL HYPO 18GX1.5 BLUNT FILL (NEEDLE) ×1 IMPLANT
NDL INSUFFLATION 14GA 120MM (NEEDLE) ×1 IMPLANT
NEEDLE HYPO 18GX1.5 BLUNT FILL (NEEDLE) ×3 IMPLANT
NEEDLE HYPO 22GX1.5 SAFETY (NEEDLE) ×3 IMPLANT
NEEDLE INSUFFLATION 14GA 120MM (NEEDLE) ×3 IMPLANT
NS IRRIG 1000ML POUR BTL (IV SOLUTION) ×3 IMPLANT
PACK LAP CHOLE LZT030E (CUSTOM PROCEDURE TRAY) ×3 IMPLANT
PAD ARMBOARD 7.5X6 YLW CONV (MISCELLANEOUS) ×3 IMPLANT
PENCIL HANDSWITCHING (ELECTRODE) ×3 IMPLANT
RELOAD 45 VASCULAR/THIN (ENDOMECHANICALS) ×3 IMPLANT
RELOAD STAPLE 45 2.5 WHT GRN (ENDOMECHANICALS) IMPLANT
SET BASIN LINEN APH (SET/KITS/TRAYS/PACK) ×3 IMPLANT
SHEARS HARMONIC ACE PLUS 36CM (ENDOMECHANICALS) ×3 IMPLANT
STAPLER VISISTAT (STAPLE) ×3 IMPLANT
SUT MNCRL AB 4-0 PS2 18 (SUTURE) ×4 IMPLANT
SUT VICRYL 0 UR6 27IN ABS (SUTURE) ×3 IMPLANT
SYR 20CC LL (SYRINGE) ×6 IMPLANT
SYS BAG RETRIEVAL 10MM (BASKET) ×1
SYSTEM BAG RETRIEVAL 10MM (BASKET) ×1 IMPLANT
TRAY FOLEY W/BAG SLVR 16FR (SET/KITS/TRAYS/PACK) ×3
TRAY FOLEY W/BAG SLVR 16FR ST (SET/KITS/TRAYS/PACK) ×1 IMPLANT
TROCAR ENDO BLADELESS 11MM (ENDOMECHANICALS) ×3 IMPLANT
TROCAR ENDO BLADELESS 12MM (ENDOMECHANICALS) ×3 IMPLANT
TROCAR XCEL NON-BLD 5MMX100MML (ENDOMECHANICALS) ×3 IMPLANT
TUBING INSUFFLATION (TUBING) ×3 IMPLANT
WARMER LAPAROSCOPE (MISCELLANEOUS) ×3 IMPLANT
YANKAUER SUCT 12FT TUBE ARGYLE (SUCTIONS) ×3 IMPLANT

## 2018-07-13 NOTE — ED Notes (Signed)
Pt to CT at this time.

## 2018-07-13 NOTE — H&P (Signed)
Kelli Hoover is an 28 y.o. female.   Chief Complaint: Right lower quadrant abdominal pain HPI: Patient is a 28 year old white female who presents the emergency room with a 12-hour history of worsening periumbilical abdominal pain which was moving to the right lower quadrant.  CT scan of the abdomen revealed acute appendicitis without perforation.  Patient does have decreased appetite.  She denies any diarrhea.  Past Medical History:  Diagnosis Date  . Migraine   . Miscarriage   . Panic attack     Past Surgical History:  Procedure Laterality Date  . DILATION AND CURETTAGE OF UTERUS    . INDUCED ABORTION      Family History  Problem Relation Age of Onset  . Hypertension Mother   . Anxiety disorder Mother   . Cancer Other   . Hypertension Other   . Anxiety disorder Sister   . Anxiety disorder Maternal Grandmother    Social History:  reports that she has been smoking cigarettes. She has a 7.00 pack-year smoking history. She has never used smokeless tobacco. She reports current alcohol use. She reports that she does not use drugs.  Allergies:  Allergies  Allergen Reactions  . Erythromycin Shortness Of Breath and Palpitations    Medications Prior to Admission  Medication Sig Dispense Refill  . cephALEXin (KEFLEX) 500 MG capsule Take 1 capsule (500 mg total) by mouth 3 (three) times daily. (Patient not taking: Reported on 07/13/2018) 15 capsule 0  . traMADol (ULTRAM) 50 MG tablet Take 1 tablet (50 mg total) by mouth every 6 (six) hours as needed. (Patient not taking: Reported on 07/13/2018) 20 tablet 0    Results for orders placed or performed during the hospital encounter of 07/13/18 (from the past 48 hour(s))  CBC with Differential     Status: Abnormal   Collection Time: 07/13/18  9:19 AM  Result Value Ref Range   WBC 9.8 4.0 - 10.5 K/uL   RBC 5.39 (H) 3.87 - 5.11 MIL/uL   Hemoglobin 15.8 (H) 12.0 - 15.0 g/dL   HCT 16.147.6 (H) 09.636.0 - 04.546.0 %   MCV 88.3 80.0 - 100.0 fL   MCH  29.3 26.0 - 34.0 pg   MCHC 33.2 30.0 - 36.0 g/dL   RDW 40.913.6 81.111.5 - 91.415.5 %   Platelets 258 150 - 400 K/uL   nRBC 0.0 0.0 - 0.2 %   Neutrophils Relative % 69 %   Neutro Abs 6.7 1.7 - 7.7 K/uL   Lymphocytes Relative 27 %   Lymphs Abs 2.6 0.7 - 4.0 K/uL   Monocytes Relative 3 %   Monocytes Absolute 0.3 0.1 - 1.0 K/uL   Eosinophils Relative 1 %   Eosinophils Absolute 0.1 0.0 - 0.5 K/uL   Basophils Relative 0 %   Basophils Absolute 0.0 0.0 - 0.1 K/uL   Immature Granulocytes 0 %   Abs Immature Granulocytes 0.04 0.00 - 0.07 K/uL    Comment: Performed at [facility], [address] [place], [place] [address]  Comprehensive metabolic panel     Status: Abnormal   Collection Time: 07/13/18  9:19 AM  Result Value Ref Range   Sodium 135 135 - 145 mmol/L   Potassium 3.2 (L) 3.5 - 5.1 mmol/L   Chloride 101 98 - 111 mmol/L   CO2 23 22 - 32 mmol/L   Glucose, Bld 108 (H) 70 - 99 mg/dL   BUN 5 (L) 6 - 20 mg/dL   Creatinine, Ser 6.210.59 0.44 - 1.00 mg/dL  Calcium 9.2 8.9 - 10.3 mg/dL   Total Protein 8.1 6.5 - 8.1 g/dL   Albumin 4.4 3.5 - 5.0 g/dL   AST 12 (L) 15 - 41 U/L   ALT 9 0 - 44 U/L   Alkaline Phosphatase 89 38 - 126 U/L   Total Bilirubin 0.7 0.3 - 1.2 mg/dL   GFR calc non Af Amer >60 >60 mL/min   GFR calc Af Amer >60 >60 mL/min   Anion gap 11 5 - 15    Comment: Performed at [facility], [address] [place], [place] [address]  Lipase, blood     Status: None   Collection Time: 07/13/18  9:19 AM  Result Value Ref Range   Lipase 32 11 - 51 U/L    Comment: Performed at [facility], [address] [place], [place] [address]  Urinalysis, Routine w reflex microscopic     Status: Abnormal   Collection Time: 07/13/18  9:20 AM  Result Value Ref Range   Color, Urine YELLOW YELLOW   APPearance CLEAR CLEAR   Specific Gravity, Urine 1.013 1.005 - 1.030   pH 8.0 5.0 - 8.0   Glucose, UA NEGATIVE NEGATIVE mg/dL   Hgb urine dipstick MODERATE (A) NEGATIVE   Bilirubin Urine NEGATIVE NEGATIVE    Ketones, ur NEGATIVE NEGATIVE mg/dL   Protein, ur NEGATIVE NEGATIVE mg/dL   Nitrite NEGATIVE NEGATIVE   Leukocytes,Ua NEGATIVE NEGATIVE   RBC / HPF 0-5 0 - 5 RBC/hpf   WBC, UA 0-5 0 - 5 WBC/hpf   Bacteria, UA NONE SEEN NONE SEEN   Squamous Epithelial / LPF 0-5 0 - 5   Mucus PRESENT     Comment: Performed at [facility], [address] [place], [place] [address]  I-Stat Beta hCG blood, ED ([facility], [facility], [facility] only)     Status: None   Collection Time: 07/13/18  9:49 AM  Result Value Ref Range   I-stat hCG, quantitative <5.0 <5 mIU/mL   Comment 3            Comment:   GEST. AGE      CONC.  (mIU/mL)   <=1 WEEK        5 - 50     2 WEEKS       50 - 500     3 WEEKS       100 - 10,000     4 WEEKS     1,000 - 30,000        FEMALE AND NON-PREGNANT FEMALE:     LESS THAN 5 mIU/mL   SARS Coronavirus 2 (CEPHEID - Performed in [facility] hospital lab), Hosp Order     Status: None   Collection Time: 07/13/18 12:08 PM   Specimen: Nasopharyngeal Swab  Result Value Ref Range   SARS Coronavirus 2 NEGATIVE NEGATIVE    Comment: (NOTE) If result is NEGATIVE SARS-CoV-2 target nucleic acids are NOT DETECTED. The SARS-CoV-2 RNA is generally detectable in upper and lower  respiratory specimens during the acute phase of infection. The lowest  concentration of SARS-CoV-2 viral copies this assay can detect is 250  copies / mL. A negative result does not preclude SARS-CoV-2 infection  and should not be used as the sole basis for treatment or other  patient management decisions.  A negative result may occur with  improper specimen collection / handling, submission of specimen other  than nasopharyngeal swab, presence of viral mutation(s) within the  areas targeted by this assay, and inadequate  number of viral copies  (<250 copies / mL). A negative result must be combined with clinical  observations, patient history, and epidemiological information. If result is POSITIVE SARS-CoV-2 target nucleic acids are  DETECTED. The SARS-CoV-2 RNA is generally detectable in upper and lower  respiratory specimens dur ing the acute phase of infection.  Positive  results are indicative of active infection with SARS-CoV-2.  Clinical  correlation with patient history and other diagnostic information is  necessary to determine patient infection status.  Positive results do  not rule out bacterial infection or co-infection with other viruses. If result is PRESUMPTIVE POSTIVE SARS-CoV-2 nucleic acids MAY BE PRESENT.   A presumptive positive result was obtained on the submitted specimen  and confirmed on repeat testing.  While 2019 novel coronavirus  (SARS-CoV-2) nucleic acids may be present in the submitted sample  additional confirmatory testing may be necessary for epidemiological  and / or clinical management purposes  to differentiate between  SARS-CoV-2 and other Sarbecovirus currently known to infect humans.  If clinically indicated additional testing with an alternate test  methodology [phone]) is advised. The SARS-CoV-2 RNA is generally  detectable in upper and lower respiratory sp ecimens during the acute  phase of infection. The expected result is Negative. Fact Sheet for Patients:  StrictlyIdeas.no Fact Sheet for Healthcare Providers: BankingDealers.co.za This test is not yet approved or cleared by the [place] FDA and has been authorized for detection and/or diagnosis of SARS-CoV-2 by FDA under an Emergency Use Authorization (EUA).  This EUA will remain in effect (meaning this test can be used) for the duration of the COVID-19 declaration under Section 564(b)(1) of the Act, 21 U.S.C. section 360bbb-3(b)(1), unless the authorization is terminated or revoked sooner. Performed at [facility], [address] [place], [place] [address]    Ct Abdomen Pelvis W Contrast  Result Date: 07/13/2018 CLINICAL DATA:  Abdominal pain and vomiting EXAM: CT  ABDOMEN AND PELVIS WITH CONTRAST TECHNIQUE: Multidetector CT imaging of the abdomen and pelvis was performed using the standard protocol following bolus administration of intravenous contrast. CONTRAST:  153mL OMNIPAQUE IOHEXOL 300 MG/ML  SOLN COMPARISON:  April 05, 2015 FINDINGS: Lower chest: Lung bases are clear. Hepatobiliary: No focal liver lesions are appreciable. There is slight fatty infiltration near the fissure for the ligamentum teres. Gallbladder wall is not appreciably thickened. There is, however, enhancing focus of increased attenuation within the fundus of the gallbladder measuring 7 x 5 mm. There is no appreciable biliary duct dilatation. Pancreas: There is no evident pancreatic mass or inflammatory focus. Spleen: No splenic lesions are evident. Adrenals/Urinary Tract: Adrenals bilaterally appear normal. There is fetal lobulation in each kidney, an anatomic variant. There is no evident renal mass or hydronephrosis on either side. There is an extrarenal pelvis on each side, an anatomic variant. There is no appreciable renal or ureteral calculus on either side. Urinary bladder is midline with wall thickness within normal limits. Stomach/Bowel: There is no appreciable bowel wall or mesenteric thickening. There is no evident bowel obstruction. The terminal ileum appears unremarkable. There is no evident free air or portal venous air. Vascular/Lymphatic: There is no abdominal aortic aneurysm. No vascular lesions are evident. There is no adenopathy in the abdomen or pelvis. Reproductive: The uterus is anteverted.  No pelvic masses evident. Other: The appendix is dilated to as much as 13 mm. There is mild enhancement of much of the appendiceal wall. There is no appreciable periappendiceal fluid or abscess. No perforation in this area. There  is subtle soft tissue stranding along the proximal aspect of the appendix. No evident appendicoliths. No ascites or abscess is evident in the abdomen or pelvis.  Musculoskeletal: There are no blastic or lytic bone lesions. No intramuscular or abdominal wall lesions evident. IMPRESSION: 1.  Findings indicative of a degree of acute appendicitis. Appendix: Location: Appendix arises medial to the cecum and extends slightly medially and inferiorly into the upper to mid pelvis. Appendiceal tip is at the mid sacral level on the right. Diameter: Up to 13 mm Appendicolith: None Mucosal hyper-enhancement: Present Extraluminal gas: None Periappendiceal collection: None. Slight soft tissue stranding near the proximal appendix noted. 2. Enhancing focus within the gallbladder measuring 7 x 5 mm. No gallbladder wall thickening. This finding warrants nonemergent gallbladder ultrasound for further assessment. 3.  No bowel obstruction.  No abscess in the abdomen or pelvis. 4. No renal or ureteral calculus. No hydronephrosis. Urinary bladder wall thickness within normal limits. Critical Value/emergent results were called by telephone at the time of interpretation on 07/13/2018 at 11:33 am to Dr. Ruthy DickZachowski, ED physician , who verbally acknowledged these results. Electronically Signed   By: Bretta BangWilliam  Woodruff III M.D.   On: 07/13/2018 11:34    Review of Systems  Constitutional: Positive for malaise/fatigue.  HENT: Negative.   Eyes: Negative.   Respiratory: Negative.   Cardiovascular: Negative.   Gastrointestinal: Negative.   Genitourinary: Negative.   Musculoskeletal: Negative.   Skin: Negative.   Neurological: Negative.   Endo/Heme/Allergies: Negative.   Psychiatric/Behavioral: Negative.     Blood pressure 103/73, pulse 69, temperature 98.9 F (37.2 C), temperature source Oral, resp. rate 18, height 5' (1.524 m), weight 52.2 kg, last menstrual period 07/06/2018, SpO2 100 %, unknown if currently breastfeeding. Physical Exam  Vitals reviewed. Constitutional: She is oriented to person, place, and time. She appears well-developed and well-nourished. No distress.  HENT:  Head:  Normocephalic and atraumatic.  Cardiovascular: Normal rate, regular rhythm and normal heart sounds. Exam reveals no gallop and no friction rub.  No murmur heard. Respiratory: Effort normal and breath sounds normal. No respiratory distress. She has no wheezes. She has no rales.  GI: Soft. Bowel sounds are normal. She exhibits no distension. There is abdominal tenderness. There is rebound and guarding.  Tender in the right lower quadrant to palpation.  No rigidity is noted.  Neurological: She is alert and oriented to person, place, and time.  Skin: Skin is warm and dry.    CT scan images personally reviewed Assessment/Plan Impression: Acute appendicitis Plan: Patient be taken to the operating room for laparoscopic appendectomy.  The risks and benefits of the procedure including bleeding, infection, and the possibility of an open procedure were fully explained to the patient, who gave informed consent.  Franky MachoMark Willmar Stockinger, MD 07/13/2018, 2:56 PM

## 2018-07-13 NOTE — Transfer of Care (Signed)
Immediate Anesthesia Transfer of Care Note  Patient: Kelli Hoover  Procedure(s) Performed: LAPAROSCOPIC APPENDECTOMY (N/A )  Patient Location: PACU  Anesthesia Type:General  Level of Consciousness: drowsy  Airway & Oxygen Therapy: Patient Spontanous Breathing and Patient connected to nasal cannula oxygen  Post-op Assessment: Report given to RN, Post -op Vital signs reviewed and stable and Patient moving all extremities  Post vital signs: Reviewed and stable  Last Vitals:  Vitals Value Taken Time  BP    Temp    Pulse    Resp    SpO2      Last Pain:  Vitals:   07/13/18 1431  TempSrc: Oral  PainSc: 6       Patients Stated Pain Goal: 5 (41/66/06 [id number])  Complications: No apparent anesthesia complications

## 2018-07-13 NOTE — Anesthesia Postprocedure Evaluation (Signed)
Anesthesia Post Note  Patient: Kelli Hoover  Procedure(s) Performed: LAPAROSCOPIC APPENDECTOMY (N/A )  Patient location during evaluation: PACU Anesthesia Type: General Level of consciousness: patient cooperative and awake Pain management: pain level controlled Vital Signs Assessment: post-procedure vital signs reviewed and stable Respiratory status: spontaneous breathing, respiratory function stable and nonlabored ventilation Cardiovascular status: blood pressure returned to baseline Postop Assessment: no apparent nausea or vomiting Anesthetic complications: no     Last Vitals:  Vitals:   07/13/18 1431 07/13/18 1600  BP: 103/73 107/77  Pulse: 69 66  Resp: 18 18  Temp: 37.2 C 36.5 C  SpO2: 100% 97%    Last Pain:  Vitals:   07/13/18 1600  TempSrc:   PainSc: 0-No pain                 Akiba Melfi J

## 2018-07-13 NOTE — ED Provider Notes (Signed)
[facility]GENCY DEPARTMENT Provider Note   CSN: [id number] Arrival date & time: 07/13/18  [id number]  History   Chief Complaint Abdominal Pain  HPI Kelli Hoover is a 28 y.o. female with past medical history significant for Migraine, GAD, miscarriage who presents for evaluation of abdominal pain and emesis. Symptoms started yesterday evening after eating [employer]. No Has had 3 episodes of NBNB emesis. Abdominal pain located to generalized abdomen however worse to epigastric area. Rates her pain a 10/10. Described as aching and sharp. Denies radiation of pain. Has tried Tylenol and Ibuprofen without relief of pain. Symptoms constant since onset. Denies additional aggrivating or alleviating factors. Last BM yesterday without melena or hematochezia. Denies hx of previous symptoms. Denies THC use. Denies fever, chills, HA, neck pain, stiffness, CP, SOB, Cough, hematemesis, pelvic pain, vaginal discharge, abnormal vaginal bleeding, diarrhea, constipation, dysuria or chance of pregnancy.  Denies alcohol use, hx of pancreatitis, DM. Admits to chronic NSAID use, everyday for her chronic migraines.  LMP:07/06/18 Prior abd surgeries: C-section Last PO intake: Solid food yesterday, Water this morning at 8am  History obtained from patient. No interpretor was used.     HPI  Past Medical History:  Diagnosis Date   Migraine    Miscarriage    Panic attack     Patient Active Problem List   Diagnosis Date Noted   Acute appendicitis    GAD (generalized anxiety disorder) 07/20/2014    Past Surgical History:  Procedure Laterality Date   DILATION AND CURETTAGE OF UTERUS     INDUCED ABORTION       OB History    Gravida  2   Para      Term      Preterm      AB      Living        SAB      TAB      Ectopic      Multiple      Live Births               Home Medications    Prior to Admission medications   Medication Sig Start Date End Date Taking? Authorizing  Provider  cephALEXin (KEFLEX) 500 MG capsule Take 1 capsule (500 mg total) by mouth 3 (three) times daily. Patient not taking: Reported on 07/13/2018 07/24/17   Bethann BerkshireZammit, Joseph, MD  traMADol (ULTRAM) 50 MG tablet Take 1 tablet (50 mg total) by mouth every 6 (six) hours as needed. Patient not taking: Reported on 07/13/2018 07/24/17   Bethann BerkshireZammit, Joseph, MD    Family History Family History  Problem Relation Age of Onset   Hypertension Mother    Anxiety disorder Mother    Cancer Other    Hypertension Other    Anxiety disorder Sister    Anxiety disorder Maternal Grandmother     Social History Social History   Tobacco Use   Smoking status: Current Every Day Smoker    Packs/day: 1.00    Years: 7.00    Pack years: 7.00    Types: Cigarettes   Smokeless tobacco: Never Used  Substance Use Topics   Alcohol use: Yes    Comment: occasionally   Drug use: No     Allergies   Erythromycin   Review of Systems Review of Systems  Constitutional: Negative.   HENT: Negative.   Respiratory: Negative.   Cardiovascular: Negative.   Gastrointestinal: Positive for abdominal pain, nausea and vomiting. Negative for abdominal distention, anal bleeding, blood in  stool, constipation, diarrhea and rectal pain.  Genitourinary: Negative.   Musculoskeletal: Negative.   Skin: Negative.   Neurological: Negative.   All other systems reviewed and are negative.    Physical Exam Updated Vital Signs BP 107/77 (BP Location: Left Arm)    Pulse 66    Temp 97.7 F (36.5 C)    Resp 18    Ht 5' (1.524 m)    Wt 52.2 kg    LMP 07/06/2018    SpO2 97%    BMI 22.46 kg/m   Physical Exam Vitals signs and nursing note reviewed.  Constitutional:      General: She is not in acute distress.    Appearance: She is well-developed. She is not ill-appearing, toxic-appearing or diaphoretic.  HENT:     Head: Atraumatic.     Mouth/Throat:     Pharynx: Oropharynx is clear.     Comments: Mildly dry mucous  membranes Eyes:     Extraocular Movements: Extraocular movements intact.     Pupils: Pupils are equal, round, and reactive to light.  Neck:     Musculoskeletal: Normal range of motion.  Cardiovascular:     Rate and Rhythm: Normal rate.     Heart sounds: Normal heart sounds. No murmur. No friction rub. No gallop.   Pulmonary:     Effort: Pulmonary effort is normal. No respiratory distress.     Breath sounds: Normal breath sounds. No stridor. No wheezing, rhonchi or rales.  Chest:     Chest wall: No tenderness.  Abdominal:     General: Bowel sounds are normal. There is no distension.     Palpations: Abdomen is soft.     Tenderness: There is generalized abdominal tenderness and tenderness in the right lower quadrant, epigastric area and periumbilical area. There is no right CVA tenderness, left CVA tenderness, guarding or rebound. Positive signs include McBurney's sign. Negative signs include Murphy's sign.     Hernia: No hernia is present.     Comments: Soft without rebound or guarding. Normoactive bowel sounds. No overlying skin changes to abd wall. No obvious abd wall herniation. Negative Murphy sign. No CVA tenderness  Musculoskeletal: Normal range of motion.  Skin:    General: Skin is warm and dry.     Capillary Refill: Capillary refill takes less than 2 seconds.  Neurological:     Mental Status: She is alert.     Comments: Phonation normal. No facial droop.    ED Treatments / Results  Labs (all labs ordered are listed, but only abnormal results are displayed) Labs Reviewed  CBC WITH DIFFERENTIAL/PLATELET - Abnormal; Notable for the following components:      Result Value   RBC 5.39 (*)    Hemoglobin 15.8 (*)    HCT 47.6 (*)    All other components within normal limits  COMPREHENSIVE METABOLIC PANEL - Abnormal; Notable for the following components:   Potassium 3.2 (*)    Glucose, Bld 108 (*)    BUN 5 (*)    AST 12 (*)    All other components within normal limits   URINALYSIS, ROUTINE W REFLEX MICROSCOPIC - Abnormal; Notable for the following components:   Hgb urine dipstick MODERATE (*)    All other components within normal limits  SARS CORONAVIRUS 2 (HOSPITAL ORDER, PERFORMED IN [facility])  LIPASE, BLOOD  I-STAT BETA HCG BLOOD, ED ([facility], [facility], [facility] ONLY)  SURGICAL PATHOLOGY    EKG None  Radiology Ct Abdomen Pelvis W Contrast  Result Date: 07/13/2018 CLINICAL DATA:  Abdominal pain and vomiting EXAM: CT ABDOMEN AND PELVIS WITH CONTRAST TECHNIQUE: Multidetector CT imaging of the abdomen and pelvis was performed using the standard protocol following bolus administration of intravenous contrast. CONTRAST:  100mL OMNIPAQUE IOHEXOL 300 MG/ML  SOLN COMPARISON:  April 05, 2015 FINDINGS: Lower chest: Lung bases are clear. Hepatobiliary: No focal liver lesions are appreciable. There is slight fatty infiltration near the fissure for the ligamentum teres. Gallbladder wall is not appreciably thickened. There is, however, enhancing focus of increased attenuation within the fundus of the gallbladder measuring 7 x 5 mm. There is no appreciable biliary duct dilatation. Pancreas: There is no evident pancreatic mass or inflammatory focus. Spleen: No splenic lesions are evident. Adrenals/Urinary Tract: Adrenals bilaterally appear normal. There is fetal lobulation in each kidney, an anatomic variant. There is no evident renal mass or hydronephrosis on either side. There is an extrarenal pelvis on each side, an anatomic variant. There is no appreciable renal or ureteral calculus on either side. Urinary bladder is midline with wall thickness within normal limits. Stomach/Bowel: There is no appreciable bowel wall or mesenteric thickening. There is no evident bowel obstruction. The terminal ileum appears unremarkable. There is no evident free air or portal venous air. Vascular/Lymphatic: There is no abdominal aortic aneurysm. No vascular lesions are evident. There is no  adenopathy in the abdomen or pelvis. Reproductive: The uterus is anteverted.  No pelvic masses evident. Other: The appendix is dilated to as much as 13 mm. There is mild enhancement of much of the appendiceal wall. There is no appreciable periappendiceal fluid or abscess. No perforation in this area. There is subtle soft tissue stranding along the proximal aspect of the appendix. No evident appendicoliths. No ascites or abscess is evident in the abdomen or pelvis. Musculoskeletal: There are no blastic or lytic bone lesions. No intramuscular or abdominal wall lesions evident. IMPRESSION: 1.  Findings indicative of a degree of acute appendicitis. Appendix: Location: Appendix arises medial to the cecum and extends slightly medially and inferiorly into the upper to mid pelvis. Appendiceal tip is at the mid sacral level on the right. Diameter: Up to 13 mm Appendicolith: None Mucosal hyper-enhancement: Present Extraluminal gas: None Periappendiceal collection: None. Slight soft tissue stranding near the proximal appendix noted. 2. Enhancing focus within the gallbladder measuring 7 x 5 mm. No gallbladder wall thickening. This finding warrants nonemergent gallbladder ultrasound for further assessment. 3.  No bowel obstruction.  No abscess in the abdomen or pelvis. 4. No renal or ureteral calculus. No hydronephrosis. Urinary bladder wall thickness within normal limits. Critical Value/emergent results were called by telephone at the time of interpretation on 07/13/2018 at 11:33 am to Dr. Ruthy DickZachowski, ED physician , who verbally acknowledged these results. Electronically Signed   By: Bretta BangWilliam  Woodruff III M.D.   On: 07/13/2018 11:34    Procedures Procedures (including critical care time)  Medications Ordered in ED Medications  Chlorhexidine Gluconate Cloth 2 % PADS 6 each (has no administration in time range)    And  Chlorhexidine Gluconate Cloth 2 % PADS 6 each (has no administration in time range)  lactated ringers  infusion ( Intravenous Anesthesia Volume Adjustment 07/13/18 1559)  lactated ringers infusion (has no administration in time range)  HYDROcodone-acetaminophen (NORCO) 7.5-325 MG per tablet 1 tablet (has no administration in time range)  HYDROmorphone (DILAUDID) injection 0.25-0.5 mg (has no administration in time range)  meperidine (DEMEROL) injection 6.25-12.5 mg (has no administration in time range)  promethazine (PHENERGAN)  injection 6.25-12.5 mg (has no administration in time range)  sodium chloride 0.9 % bolus 1,000 mL (0 mLs Intravenous Stopped 07/13/18 1205)  ondansetron (ZOFRAN) injection 4 mg (4 mg Intravenous Given 07/13/18 0938)  morphine 4 MG/ML injection 4 mg (4 mg Intravenous Given 07/13/18 0938)  iohexol (OMNIPAQUE) 300 MG/ML solution 100 mL (100 mLs Intravenous Contrast Given 07/13/18 1057)  HYDROmorphone (DILAUDID) injection 1 mg (1 mg Intravenous Given 07/13/18 1211)  piperacillin-tazobactam (ZOSYN) IVPB 3.375 g (0 g Intravenous Stopped 07/13/18 1251)  midazolam (VERSED) 2 MG/2ML injection (has no administration in time range)  fentaNYL (SUBLIMAZE) 250 MCG/5ML injection (has no administration in time range)  succinylcholine (ANECTINE) 200 MG/10ML syringe (has no administration in time range)  rocuronium bromide 100 MG/10ML SOSY (has no administration in time range)  ondansetron (ZOFRAN) 4 MG/2ML injection (has no administration in time range)  bupivacaine liposome (EXPAREL) 1.3 % injection (has no administration in time range)  sugammadex sodium (BRIDION) 500 MG/5ML injection (has no administration in time range)   Initial Impression / Assessment and Plan / ED Course  I have reviewed the triage vital signs and the nursing notes.  Pertinent labs & imaging results that were available during my care of the patient were reviewed by me and considered in my medical decision making (see chart for details).  28 year old who appears otherwise well presents for evaluation of 12 hours of abd  pain. Afebrile, non septic, non ill appearing. Abd pain generalized however worse to epigastric region, periumbilical and RLQ.  No rebound or guarding.  Normoactive bowel sounds.  No abdominal wall skin changes.  No obvious herniations.  Negative Murphy sign.  Symptoms did began after she ate [employer] yesterday.  Has had 3 episodes of NBNB emesis.  No diarrhea, constipation, melena, hematochezia or hematemesis.  Prior abdominal surgeries include C-section.  Denies alcohol use however states she does use NSAIDs almost daily for her chronic headaches.  Denies history of abdominal ulcers or perforations.  Heart and lungs clear.  Will obtain labs, provide IV fluids, antiemetics, analgesia as well as CT scan.  DDX: Perforated viscus, Gastritis, Gallstones, Pancreatitis, Ulcer, dissection, appendicitis, PID, TOA, Torsion.  Labs and imaging personally reviewed: CBC: without leukocytosis TOI:[id number] likely due to emesis. No additional electrolyte, renal or liver abnormalities. Lipase: 32 Urinalysis: Negative for infection HCG: Negative CT AP: Acute appendicitis  1100: On reevaluation pain with improvement however with continued pain worse to periumbilical. Positive McBurney point. No emesis in ED. Will provide additional pain medication and consult surgery for acute appendicitis. COVID test ordered.  1215: Consulted with Dr. Arnoldo Morale with general surgery who aggres to evaluation patient for admission. Patient given ABX- Zosyn in ED. Patient placed NPO. Pain well controlled. VS stable. Place for surgery around 1500. Patient aware of plans and all questions answered.  The patient appears reasonably stabilized for admission considering the current resources, flow, and capabilities available in the ED at this time, and I doubt any other [facility] requiring further screening and/or treatment in the ED prior to admission.     Final Clinical Impressions(s) / ED Diagnoses   Final diagnoses:  Acute appendicitis,  unspecified acute appendicitis type    ED Discharge Orders    None       Davian Wollenberg A, PA-C 07/13/18 1617    Fredia Sorrow, MD 07/18/18 [phone]

## 2018-07-13 NOTE — Anesthesia Preprocedure Evaluation (Signed)
Anesthesia Evaluation    Airway Mallampati: II       Dental  (+) Poor Dentition   Pulmonary Current Smoker,    breath sounds clear to auscultation       Cardiovascular  Rhythm:regular     Neuro/Psych  Headaches, PSYCHIATRIC DISORDERS Anxiety    GI/Hepatic   Endo/Other    Renal/GU      Musculoskeletal   Abdominal   Peds  Hematology   Anesthesia Other Findings 24-plus hr h/o abd pain. States NPO since yesterday afternoon Contrast with CT scan PLAN RSI/Cricoid pressure GA/ETT  Reproductive/Obstetrics negative OB ROS                             Anesthesia Physical Anesthesia Plan  ASA: II  Anesthesia Plan: General   Post-op Pain Management:    Induction:   PONV Risk Score and Plan:   Airway Management Planned:   Additional Equipment:   Intra-op Plan:   Post-operative Plan:   Informed Consent: I have reviewed the patients History and Physical, chart, labs and discussed the procedure including the risks, benefits and alternatives for the proposed anesthesia with the patient or authorized representative who has indicated his/her understanding and acceptance.       Plan Discussed with: Anesthesiologist  Anesthesia Plan Comments:         Anesthesia Quick Evaluation

## 2018-07-13 NOTE — Op Note (Signed)
Patient:  Kelli Hoover  DOB:  24-Jul-1990  MRN:  [id number]   Preop Diagnosis: Acute appendicitis  Postop Diagnosis: Same  Procedure: Laparoscopic appendectomy  Surgeon: Aviva Signs, MD  Anes: General endotracheal  Indications: Patient is a 28 year old white female who presents with a less than 24-hour history of worsening right lower quadrant abdominal pain.  CT scan of the abdomen reveals acute appendicitis.  The risks and benefits of the procedure including bleeding, infection, and the possibility of an open procedure were fully explained to the patient, who gave informed consent.  Procedure note: The patient was placed in the supine position.  After induction of general endotracheal anesthesia, the abdomen was prepped and draped using the usual sterile technique with ChloraPrep.  Surgical site confirmation was performed.  A supraumbilical incision was made down to the fascia.  A Veress needle was introduced into the abdominal cavity and confirmation of placement was done using saline drop test.  The abdomen was then insufflated to 15 mmHg pressure.  An 11 mm trocar was introduced into the abdominal cavity under direct visualization without difficulty.  The patient was placed in deeper Trendelenburg position and an additional 12 mm trocar was placed in the suprapubic region and a 5 mm trocar was placed in the left lower quadrant region.  The appendix was easily visualized.  The distal two thirds of the appendix was noted to be swollen and injected.  There was no evidence of perforation.  The mesoappendix was divided using the harmonic scalpel.  The juncture of the appendix to the cecum was fully visualized.  A vascular Endo GIA was placed across this region and fired.  The appendix was then removed using an Endo Catch bag.  The staple line was inspected and noted to be within normal limits.  All fluid and air were then evacuated from the abdominal cavity prior to the removal of the  trochars.  All wounds were irrigated with normal saline.  All wounds were injected with Exparel.  The supraumbilical fascia was reapproximated using an 0 Vicryl interrupted suture.  All skin incisions were closed using a 4-0 Monocryl subcuticular suture.  Dermabond was applied.  All tape and needle counts were correct at the end of the procedure.  The patient was extubated in the operating room and transferred to PACU in stable condition.  Complications: None  EBL: Minimal  Specimen: Appendix

## 2018-07-13 NOTE — Anesthesia Procedure Notes (Signed)
Procedure Name: Intubation Date/Time: 07/13/2018 3:11 PM Performed by: Charmaine Downs, CRNA Pre-anesthesia Checklist: Patient identified, Patient being monitored, Timeout performed, Emergency Drugs available and Suction available Patient Re-evaluated:Patient Re-evaluated prior to induction Oxygen Delivery Method: Circle System Utilized Preoxygenation: Pre-oxygenation with 100% oxygen Induction Type: IV induction Ventilation: Mask ventilation without difficulty Laryngoscope Size: Mac and 3 Grade View: Grade I Tube type: Oral Tube size: 7.0 mm Number of attempts: 1 Airway Equipment and Method: stylet Placement Confirmation: ETT inserted through vocal cords under direct vision,  positive ETCO2 and breath sounds checked- equal and bilateral Secured at: 22 cm Tube secured with: Tape Dental Injury: Teeth and Oropharynx as per pre-operative assessment

## 2018-07-13 NOTE — ED Notes (Signed)
Pt to OR at this time.

## 2018-07-13 NOTE — ED Triage Notes (Signed)
Patient complaining of abdominal pain and vomiting since yesterday after eating [employer].

## 2018-07-14 ENCOUNTER — Encounter (HOSPITAL_COMMUNITY): Payer: Self-pay | Admitting: General Surgery

## 2018-07-14 MED ORDER — OXYCODONE-ACETAMINOPHEN 7.5-325 MG PO TABS: 1.0000 | ORAL_TABLET | Freq: Four times a day (QID) | ORAL | 0 refills | Status: DC | PRN

## 2018-07-14 NOTE — Discharge Instructions (Signed)
Laparoscopic Appendectomy, Adult, Care After  This sheet gives you information about how to care for yourself after your procedure. Your health care provider may also give you more specific instructions. If you have problems or questions, contact your health care provider.  What can I expect after the procedure?  After the procedure, it is common to have:  · Little energy for normal activities.  · Mild pain in the area where the incisions were made.  · Difficulty passing stool (constipation). This can be caused by:  ? Pain medicine.  ? A decrease in your activity.  Follow these instructions at home:  Medicines  · Take over-the-counter and prescription medicines only as told by your health care provider.  · If you were prescribed an antibiotic medicine, take it as told by your health care provider. Do not stop taking the antibiotic even if you start to feel better.  · Do not drive or use heavy machinery while taking prescription pain medicine.  · Ask your health care provider if the medicine prescribed to you can cause constipation. You may need to take steps to prevent or treat constipation, such as:  ? Drink enough fluid to keep your urine pale yellow.  ? Take over-the-counter or prescription medicines.  ? Eat foods that are high in fiber, such as beans, whole grains, and fresh fruits and vegetables.  ? Limit foods that are high in fat and processed sugars, such as fried or sweet foods.  Incision care    · Follow instructions from your health care provider about how to take care of your incisions. Make sure you:  ? Wash your hands with soap and water before and after you change your bandage (dressing). If soap and water are not available, use hand sanitizer.  ? Change your dressing as told by your health care provider.  ? Leave stitches (sutures), skin glue, or adhesive strips in place. These skin closures may need to stay in place for 2 weeks or longer. If adhesive strip edges start to loosen and curl up, you may  trim the loose edges. Do not remove adhesive strips completely unless your health care provider tells you to do that.  · Check your incision areas every day for signs of infection. Check for:  ? Redness, swelling, or pain.  ? Fluid or blood.  ? Warmth.  ? Pus or a bad smell.  Bathing  · Keep your incisions clean and dry. Clean them as often as told by your health care provider. To do this:  1. Gently wash the incisions with soap and water.  2. Rinse the incisions with water to remove all soap.  3. Pat the incisions dry with a clean towel. Do not rub the incisions.  · Do not take baths, swim, or use a hot tub for 2 weeks, or until your health care provider approves. You may take showers after 48 hours.  Activity    · Do not drive for 24 hours if you were given a sedative during your procedure.  · Rest after the procedure. Return to your normal activities as told by your health care provider. Ask your health care provider what activities are safe for you.  · For 3 weeks, or for as long as told by your health care provider:  ? Do not lift anything that is heavier than 10 lb (4.5 kg), or the limit that you are told.  ? Do not play contact sports.  General instructions  · If you   were sent home with a drain, follow instructions from your health care provider about how to care for it.  · Take deep breaths. This helps to prevent your lungs from developing an infection (pneumonia).  · Keep all follow-up visits as told by your health care provider. This is important.  Contact a health care provider if:  · You have redness, swelling, or pain around an incision.  · You have fluid or blood coming from an incision.  · Your incision feels warm to the touch.  · You have pus or a bad smell coming from an incision or dressing.  · Your incision edges break open after your sutures have been removed.  · You have increasing pain in your shoulders.  · You feel dizzy or you faint.  · You develop shortness of breath.  · You keep feeling  nauseous or you are vomiting.  · You have diarrhea or you cannot control your bowel functions.  · You lose your appetite.  · You develop swelling or pain in your legs.  · You develop a rash.  Get help right away if you have:  · A fever.  · Difficulty breathing.  · Sharp pains in your chest.  Summary  · After a laparoscopic appendectomy, it is common to have little energy for normal activities, mild pain in the area of the incisions, and constipation.  · Infection is the most common complication after this procedure. Follow your health care provider's instructions about caring for yourself after the procedure.  · Rest after the procedure. Return to your normal activities as told by your health care provider.  · Contact your health care provider if you notice signs of infection around your incisions or you develop shortness of breath. Get help right away if you have a fever, chest pain, or difficulty breathing.  This information is not intended to replace advice given to you by your health care provider. Make sure you discuss any questions you have with your health care provider.  Document Released: 01/06/2005 Document Revised: 07/09/2017 Document Reviewed: 07/09/2017  [employer] Interactive Patient Education © 2019 [employer]

## 2018-07-14 NOTE — Discharge Summary (Signed)
Physician Discharge Summary  Patient ID: Kelli Hoover MRN: [id number] DOB/AGE: 1990-03-06 28 y.o.  Admit date: 07/13/2018 Discharge date: 07/14/2018  Admission Diagnoses: Acute appendicitis  Discharge Diagnoses: Same Active Problems:   Acute appendicitis   Discharged Condition: good  Hospital Course: Patient is a 28 year old white female who presented to [facility] with right lower quadrant abdominal pain.  CT scan of the abdomen revealed acute appendicitis.  She underwent laparoscopic appendectomy on 07/13/2018.  She tolerated the procedure well.  Her postoperative course was unremarkable.  Her diet was advanced without difficulty.  The patient is being discharged home on 07/14/2018 in good and improving condition.  Treatments: surgery: Laparoscopic appendectomy on 07/13/2018  Discharge Exam: Blood pressure 97/70, pulse 73, temperature 98.1 F (36.7 C), temperature source Oral, resp. rate 15, height 5' (1.524 m), weight 52.2 kg, last menstrual period 07/06/2018, SpO2 99 %, unknown if currently breastfeeding. General appearance: alert, cooperative and no distress Resp: clear to auscultation bilaterally Cardio: regular rate and rhythm, S1, S2 normal, no murmur, click, rub or gallop GI: Soft, incisions healing well.  Disposition: Discharge disposition: 01-Home or Self Care       Discharge Instructions    Diet general   Complete by: As directed    Increase activity slowly   Complete by: As directed      Allergies as of 07/14/2018      Reactions   Erythromycin Shortness Of Breath, Palpitations      Medication List    STOP taking these medications   cephALEXin 500 MG capsule Commonly known as: KEFLEX   traMADol 50 MG tablet Commonly known as: ULTRAM     TAKE these medications   oxyCODONE-acetaminophen 7.5-325 MG tablet Commonly known as: Percocet Take 1 tablet by mouth every 6 (six) hours as needed.      Follow-up Information    Aviva Signs, MD  Follow up.   Specialty: General Surgery Why: Will call you next week for follow up Contact information: [address] [place] [address] [phone]           Signed: Aviva Signs 07/14/2018, 8:25 AM

## 2018-07-22 ENCOUNTER — Other Ambulatory Visit: Payer: Self-pay

## 2018-07-22 ENCOUNTER — Telehealth: Payer: Self-pay | Admitting: General Surgery

## 2018-07-22 ENCOUNTER — Telehealth (INDEPENDENT_AMBULATORY_CARE_PROVIDER_SITE_OTHER): Payer: Self-pay | Admitting: General Surgery

## 2018-07-22 DIAGNOSIS — Z09 Encounter for follow-up examination after completed treatment for conditions other than malignant neoplasm: Secondary | ICD-10-CM

## 2018-07-22 NOTE — Telephone Encounter (Signed)
Telephone call could not be completed as dialed.  No message left.

## 2018-08-27 ENCOUNTER — Emergency Department (HOSPITAL_COMMUNITY)
Admission: EM | Admit: 2018-08-27 | Discharge: 2018-08-27 | Disposition: A | Discharge status: Home / Self Care | Payer: No Typology Code available for payment source | Attending: Emergency Medicine | Admitting: Emergency Medicine

## 2018-08-27 ENCOUNTER — Emergency Department (HOSPITAL_COMMUNITY): Payer: No Typology Code available for payment source

## 2018-08-27 ENCOUNTER — Other Ambulatory Visit: Payer: Self-pay

## 2018-08-27 ENCOUNTER — Encounter (HOSPITAL_COMMUNITY): Payer: Self-pay

## 2018-08-27 DIAGNOSIS — Y999 Unspecified external cause status: Secondary | ICD-10-CM | POA: Insufficient documentation

## 2018-08-27 DIAGNOSIS — F17210 Nicotine dependence, cigarettes, uncomplicated: Secondary | ICD-10-CM | POA: Diagnosis not present

## 2018-08-27 DIAGNOSIS — Z79899 Other long term (current) drug therapy: Secondary | ICD-10-CM | POA: Diagnosis not present

## 2018-08-27 DIAGNOSIS — S0101XA Laceration without foreign body of scalp, initial encounter: Secondary | ICD-10-CM | POA: Diagnosis not present

## 2018-08-27 DIAGNOSIS — S098XXA Other specified injuries of head, initial encounter: Secondary | ICD-10-CM | POA: Diagnosis not present

## 2018-08-27 DIAGNOSIS — D72829 Elevated white blood cell count, unspecified: Secondary | ICD-10-CM | POA: Insufficient documentation

## 2018-08-27 DIAGNOSIS — Z23 Encounter for immunization: Secondary | ICD-10-CM | POA: Insufficient documentation

## 2018-08-27 DIAGNOSIS — Y929 Unspecified place or not applicable: Secondary | ICD-10-CM | POA: Diagnosis not present

## 2018-08-27 DIAGNOSIS — S0990XA Unspecified injury of head, initial encounter: Secondary | ICD-10-CM

## 2018-08-27 DIAGNOSIS — Y9389 Activity, other specified: Secondary | ICD-10-CM | POA: Diagnosis not present

## 2018-08-27 LAB — I-STAT BETA HCG BLOOD, ED (MC, WL, AP ONLY): I-stat hCG, quantitative: <5 m[IU]/mL (ref <5)

## 2018-08-27 LAB — LACTIC ACID, PLASMA: Lactic Acid, Venous: 1.6 mmol/L (ref 0.5–1.9)

## 2018-08-27 LAB — COMPREHENSIVE METABOLIC PANEL
ALT: 12 U/L (ref 0–44)
AST: 17 U/L (ref 15–41)
Albumin: 3.7 g/dL (ref 3.5–5.0)
Alkaline Phosphatase: 87 U/L (ref 38–126)
Anion gap: 11 (ref 5–15)
BUN: 6 mg/dL (ref 6–20)
CO2: 23 mmol/L (ref 22–32)
Calcium: 9.1 mg/dL (ref 8.9–10.3)
Chloride: 103 mmol/L (ref 98–111)
Creatinine, Ser: 0.75 mg/dL (ref 0.44–1.00)
GFR calc Af Amer: >60 mL/min (ref >60)
GFR calc non Af Amer: >60 mL/min (ref >60)
Glucose, Bld: 115 mg/dL — ABNORMAL HIGH (ref 70–99)
Potassium: 3.6 mmol/L (ref 3.5–5.1)
Sodium: 137 mmol/L (ref 135–145)
Total Bilirubin: 0.4 mg/dL (ref 0.3–1.2)
Total Protein: 7.2 g/dL (ref 6.5–8.1)

## 2018-08-27 LAB — CBC
HCT: 45.5 % (ref 36.0–46.0)
Hemoglobin: 15 g/dL (ref 12.0–15.0)
MCH: 29.9 pg (ref 26.0–34.0)
MCHC: 33 g/dL (ref 30.0–36.0)
MCV: 90.8 fL (ref 80.0–100.0)
Platelets: 292 10*3/uL (ref 150–400)
RBC: 5.01 MIL/uL (ref 3.87–5.11)
RDW: 13.8 % (ref 11.5–15.5)
WBC: 11.3 10*3/uL — ABNORMAL HIGH (ref 4.0–10.5)
nRBC: 0 % (ref 0.0–0.2)

## 2018-08-27 LAB — SAMPLE TO BLOOD BANK

## 2018-08-27 LAB — PROTIME-INR
INR: 1.2 (ref 0.8–1.2)
Prothrombin Time: 14.6 seconds (ref 11.4–15.2)

## 2018-08-27 LAB — ETHANOL: Alcohol, Ethyl (B): <10 mg/dL (ref <10)

## 2018-08-27 MED ORDER — LIDOCAINE-EPINEPHRINE (PF) 2 %-1:200000 IJ SOLN
20.0000 mL | Freq: Once | INTRAMUSCULAR | Status: DC
  Filled 2018-08-27: qty 20

## 2018-08-27 MED ORDER — TETANUS-DIPHTH-ACELL PERTUSSIS 5-2.5-18.5 LF-MCG/0.5 IM SUSP
0.5000 mL | Freq: Once | INTRAMUSCULAR | Status: AC
  Administered 2018-08-27: 0.5 mL via INTRAMUSCULAR
  Filled 2018-08-27: qty 0.5

## 2018-08-27 NOTE — ED Notes (Signed)
Patient transported to X-ray 

## 2018-08-27 NOTE — ED Notes (Signed)
Patient transported to CT 
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
## 2018-08-27 NOTE — ED Notes (Signed)
ED provider and ortho tech at bedside.

## 2018-08-27 NOTE — Discharge Instructions (Addendum)
Your sutures will fall out in about 5-7 days.  If they have not come out by then, please return to have them removed.  Return for new or worsening symptoms.  You will feel very sore for the next few days, this is normal.  Take ibuprofen or Tylenol for your symptoms.

## 2019-02-22 ENCOUNTER — Emergency Department (HOSPITAL_COMMUNITY): Payer: Self-pay

## 2019-02-22 ENCOUNTER — Encounter (HOSPITAL_COMMUNITY): Payer: Self-pay | Admitting: *Deleted

## 2019-02-22 ENCOUNTER — Other Ambulatory Visit: Payer: Self-pay

## 2019-02-22 ENCOUNTER — Emergency Department (HOSPITAL_COMMUNITY)
Admission: EM | Admit: 2019-02-22 | Discharge: 2019-02-22 | Disposition: A | Discharge status: Home / Self Care | Payer: Self-pay | Attending: Emergency Medicine | Admitting: Emergency Medicine

## 2019-02-22 DIAGNOSIS — R1084 Generalized abdominal pain: Secondary | ICD-10-CM

## 2019-02-22 DIAGNOSIS — Z20822 Contact with and (suspected) exposure to covid-19: Secondary | ICD-10-CM | POA: Insufficient documentation

## 2019-02-22 DIAGNOSIS — R197 Diarrhea, unspecified: Secondary | ICD-10-CM | POA: Insufficient documentation

## 2019-02-22 DIAGNOSIS — R112 Nausea with vomiting, unspecified: Secondary | ICD-10-CM | POA: Insufficient documentation

## 2019-02-22 DIAGNOSIS — F17210 Nicotine dependence, cigarettes, uncomplicated: Secondary | ICD-10-CM | POA: Insufficient documentation

## 2019-02-22 LAB — COMPREHENSIVE METABOLIC PANEL
ALT: 9 U/L (ref 0–44)
AST: 13 U/L — ABNORMAL LOW (ref 15–41)
Albumin: 4.3 g/dL (ref 3.5–5.0)
Alkaline Phosphatase: 87 U/L (ref 38–126)
Anion gap: 10 (ref 5–15)
BUN: 5 mg/dL — ABNORMAL LOW (ref 6–20)
CO2: 22 mmol/L (ref 22–32)
Calcium: 9 mg/dL (ref 8.9–10.3)
Chloride: 106 mmol/L (ref 98–111)
Creatinine, Ser: 0.51 mg/dL (ref 0.44–1.00)
GFR calc Af Amer: >60 mL/min (ref >60)
GFR calc non Af Amer: >60 mL/min (ref >60)
Glucose, Bld: 104 mg/dL — ABNORMAL HIGH (ref 70–99)
Potassium: 3.5 mmol/L (ref 3.5–5.1)
Sodium: 138 mmol/L (ref 135–145)
Total Bilirubin: 0.6 mg/dL (ref 0.3–1.2)
Total Protein: 8 g/dL (ref 6.5–8.1)

## 2019-02-22 LAB — CBC
HCT: 47.9 % — ABNORMAL HIGH (ref 36.0–46.0)
Hemoglobin: 15.8 g/dL — ABNORMAL HIGH (ref 12.0–15.0)
MCH: 29.8 pg (ref 26.0–34.0)
MCHC: 33 g/dL (ref 30.0–36.0)
MCV: 90.4 fL (ref 80.0–100.0)
Platelets: 282 10*3/uL (ref 150–400)
RBC: 5.3 MIL/uL — ABNORMAL HIGH (ref 3.87–5.11)
RDW: 14 % (ref 11.5–15.5)
WBC: 13.3 10*3/uL — ABNORMAL HIGH (ref 4.0–10.5)
nRBC: 0 % (ref 0.0–0.2)

## 2019-02-22 LAB — URINALYSIS, ROUTINE W REFLEX MICROSCOPIC
Bilirubin Urine: NEGATIVE
Glucose, UA: NEGATIVE mg/dL
Hgb urine dipstick: NEGATIVE
Ketones, ur: NEGATIVE mg/dL
Leukocytes,Ua: NEGATIVE
Nitrite: POSITIVE — AB
Protein, ur: NEGATIVE mg/dL
Specific Gravity, Urine: 1.02 (ref 1.005–1.030)
pH: 7 (ref 5.0–8.0)

## 2019-02-22 LAB — LIPASE, BLOOD: Lipase: 35 U/L (ref 11–51)

## 2019-02-22 LAB — POC SARS CORONAVIRUS 2 AG -  ED: SARS Coronavirus 2 Ag: NEGATIVE

## 2019-02-22 LAB — POC URINE PREG, ED: Preg Test, Ur: NEGATIVE

## 2019-02-22 MED ORDER — IOHEXOL 300 MG/ML  SOLN
100.0000 mL | Freq: Once | INTRAMUSCULAR | Status: AC | PRN
  Administered 2019-02-22: 100 mL via INTRAVENOUS

## 2019-02-22 MED ORDER — PROMETHAZINE HCL 25 MG PO TABS: 25.0000 mg | ORAL_TABLET | Freq: Four times a day (QID) | ORAL | 0 refills | Status: DC | PRN

## 2019-02-22 MED ORDER — PANTOPRAZOLE SODIUM 40 MG PO TBEC: 40.0000 mg | DELAYED_RELEASE_TABLET | Freq: Every day | ORAL | 0 refills | Status: DC

## 2019-02-22 MED ORDER — ONDANSETRON HCL 4 MG PO TABS: 4.0000 mg | ORAL_TABLET | Freq: Three times a day (TID) | ORAL | 0 refills | Status: DC | PRN

## 2019-02-22 MED ORDER — SODIUM CHLORIDE 0.9% FLUSH
3.0000 mL | Freq: Once | INTRAVENOUS | Status: AC
  Administered 2019-02-22: 16:00:00 3 mL via INTRAVENOUS

## 2019-02-22 MED ORDER — PROMETHAZINE HCL 25 MG/ML IJ SOLN: 12.5000 mg | Freq: Once | INTRAMUSCULAR | 0 refills | Status: DC

## 2019-02-22 MED ORDER — FENTANYL CITRATE (PF) 100 MCG/2ML IJ SOLN
50.0000 ug | Freq: Once | INTRAMUSCULAR | Status: AC
  Administered 2019-02-22: 50 ug via INTRAVENOUS
  Filled 2019-02-22: qty 2

## 2019-02-22 MED ORDER — PANTOPRAZOLE SODIUM 40 MG IV SOLR
40.0000 mg | Freq: Once | INTRAVENOUS | Status: AC
  Administered 2019-02-22: 40 mg via INTRAVENOUS
  Filled 2019-02-22: qty 40

## 2019-02-22 MED ORDER — HYDROMORPHONE HCL 1 MG/ML IJ SOLN
0.5000 mg | Freq: Once | INTRAMUSCULAR | Status: AC
  Administered 2019-02-22: 0.5 mg via INTRAVENOUS
  Filled 2019-02-22: qty 1

## 2019-02-22 MED ORDER — SODIUM CHLORIDE 0.9 % IV BOLUS
1000.0000 mL | Freq: Once | INTRAVENOUS | Status: AC
  Administered 2019-02-22: 1000 mL via INTRAVENOUS

## 2019-02-22 MED ORDER — ONDANSETRON HCL 4 MG/2ML IJ SOLN
4.0000 mg | Freq: Three times a day (TID) | INTRAMUSCULAR | Status: DC | PRN
  Administered 2019-02-22 (×2): 4 mg via INTRAVENOUS
  Filled 2019-02-22 (×2): qty 2

## 2019-02-22 MED ORDER — HYDROMORPHONE HCL 1 MG/ML IJ SOLN
0.5000 mg | Freq: Once | INTRAMUSCULAR | Status: AC
  Administered 2019-02-22: 19:00:00 0.5 mg via INTRAVENOUS
  Filled 2019-02-22: qty 1

## 2019-02-22 MED ORDER — OXYCODONE-ACETAMINOPHEN 5-325 MG PO TABS: 1.0000 | ORAL_TABLET | ORAL | 0 refills | Status: DC | PRN

## 2019-02-22 NOTE — ED Provider Notes (Signed)
[facility] EMERGENCY DEPARTMENT Provider Note   CSN: [id number] Arrival date & time: 02/22/19  1405     History Chief Complaint  Patient presents with  . Abdominal Pain    Kelli Hoover is a 29 y.o. female.  The history is provided by the patient. No language interpreter was used.  Abdominal Pain Pain location:  Generalized Pain quality: aching   Pain severity:  Moderate Onset quality:  Gradual Duration:  1 week Timing:  Constant Progression:  Worsening Chronicity:  New Context: sick contacts   Relieved by:  None tried Worsened by:  Nothing Ineffective treatments:  None tried Associated symptoms: nausea and vomiting   Risk factors: not pregnant        Past Medical History:  Diagnosis Date  . Migraine   . Miscarriage   . Panic attack     Patient Active Problem List   Diagnosis Date Noted  . Acute appendicitis   . GAD (generalized anxiety disorder) 07/20/2014    Past Surgical History:  Procedure Laterality Date  . DILATION AND CURETTAGE OF UTERUS    . INDUCED ABORTION    . LAPAROSCOPIC APPENDECTOMY N/A 07/13/2018   Procedure: LAPAROSCOPIC APPENDECTOMY;  Surgeon: Franky Macho, MD;  Location: [facility]S;  Service: General;  Laterality: N/A;     OB History    Gravida  2   Para      Term      Preterm      AB      Living        SAB      TAB      Ectopic      Multiple      Live Births              Family History  Problem Relation Age of Onset  . Hypertension Mother   . Anxiety disorder Mother   . Cancer Other   . Hypertension Other   . Anxiety disorder Sister   . Anxiety disorder Maternal Grandmother     Social History   Tobacco Use  . Smoking status: Current Every Day Smoker    Packs/day: 1.00    Years: 7.00    Pack years: 7.00    Types: Cigarettes  . Smokeless tobacco: Never Used  Substance Use Topics  . Alcohol use: Yes    Comment: occasionally  . Drug use: No    Home Medications Prior to Admission medications     Not on File    Allergies    Erythromycin  Review of Systems   Review of Systems  Gastrointestinal: Positive for abdominal pain, nausea and vomiting.  All other systems reviewed and are negative.   Physical Exam Updated Vital Signs BP (!) 157/116 (BP Location: Right Arm)   Pulse 73   Temp (!) 97.2 F (36.2 C) (Temporal)   Resp 18   Ht 5' (1.524 m)   Wt 54.4 kg   LMP 02/17/2019   SpO2 100%   BMI 23.44 kg/m   Physical Exam Vitals and nursing note reviewed.  Constitutional:      Appearance: She is well-developed.  HENT:     Head: Normocephalic.     Mouth/Throat:     Mouth: Mucous membranes are moist.  Eyes:     Extraocular Movements: Extraocular movements intact.  Cardiovascular:     Rate and Rhythm: Normal rate.  Pulmonary:     Effort: Pulmonary effort is normal.  Abdominal:     General: Abdomen is flat.  Bowel sounds are normal. There is distension.     Palpations: Abdomen is soft.     Tenderness: There is abdominal tenderness in the right upper quadrant and epigastric area.  Musculoskeletal:        General: Normal range of motion.     Cervical back: Normal range of motion.  Skin:    General: Skin is warm.  Neurological:     General: No focal deficit present.     Mental Status: She is alert and oriented to person, place, and time.  Psychiatric:        Mood and Affect: Mood normal.     ED Results / Procedures / Treatments   Labs (all labs ordered are listed, but only abnormal results are displayed) Labs Reviewed  URINALYSIS, ROUTINE W REFLEX MICROSCOPIC - Abnormal; Notable for the following components:      Result Value   APPearance HAZY (*)    Nitrite POSITIVE (*)    Bacteria, UA MANY (*)    All other components within normal limits  LIPASE, BLOOD  COMPREHENSIVE METABOLIC PANEL  CBC  POC URINE PREG, ED  POC SARS CORONAVIRUS 2 AG -  ED    EKG None  Radiology No results found.  Procedures Procedures (including critical care  time)  Medications Ordered in ED Medications  sodium chloride flush (NS) 0.9 % injection 3 mL (has no administration in time range)  ondansetron (ZOFRAN) injection 4 mg (has no administration in time range)  fentaNYL (SUBLIMAZE) injection 50 mcg (has no administration in time range)    ED Course  I have reviewed the triage vital signs and the nursing notes.  Pertinent labs & imaging results that were available during my care of the patient were reviewed by me and considered in my medical decision making (see chart for details).    MDM Rules/Calculators/A&P                      MDM  Pt given IV fluids, zofran and fentanyl ordered.  Labs and poc covid ordered. Pt's care turned over to Tammy triplett PA  Final Clinical Impression(s) / ED Diagnoses Final diagnoses:  Nausea vomiting and diarrhea  Generalized abdominal pain    Rx / DC Orders ED Discharge Orders    None       Sidney Ace 02/23/19 1334    Dorie Rank, MD 02/23/19 [phone]

## 2019-02-22 NOTE — ED Provider Notes (Signed)
Patient signed out to me by Trisha Mangle, PA-C at end of shift completion of work-up pending.  Patient is a 29 year old female with history of diffuse abdominal pain, nausea, vomiting, and diarrhea. On my exam, patient is complaining of a burning pain across her upper abdomen.  She states she did not have any relief of her pain with IV fentanyl.  She is well-appearing.  Nontoxic.  Her urinalysis shows a possible urinary tract infection.  Urine cultured. She does endorse some urinary frequency with dark-colored urine for several days, but no pain with urination.  I doubt this is source of her sx's.  I am concerned about possible gallbladder disease.  Will obtain CT of abdomen and pelvis to further evaluate her pain.  I doubt acute surgical abdomen at this time.  If CT scan is negative, I will likely have her return tomorrow for ultrasound evaluation of her gallbladder given that ultrasound is not available after hours.   Labs Reviewed  COMPREHENSIVE METABOLIC PANEL - Abnormal; Notable for the following components:      Result Value   Glucose, Bld 104 (*)    BUN 5 (*)    AST 13 (*)    All other components within normal limits  CBC - Abnormal; Notable for the following components:   WBC 13.3 (*)    RBC 5.30 (*)    Hemoglobin 15.8 (*)    HCT 47.9 (*)    All other components within normal limits  URINALYSIS, ROUTINE W REFLEX MICROSCOPIC - Abnormal; Notable for the following components:   APPearance HAZY (*)    Nitrite POSITIVE (*)    Bacteria, UA MANY (*)    All other components within normal limits  URINE CULTURE  LIPASE, BLOOD  POC URINE PREG, ED  POC SARS CORONAVIRUS 2 AG -  ED    CT ABDOMEN PELVIS W CONTRAST  Result Date: 02/22/2019 CLINICAL DATA:  Acute generalized abdominal pain.  Vomiting. EXAM: CT ABDOMEN AND PELVIS WITH CONTRAST TECHNIQUE: Multidetector CT imaging of the abdomen and pelvis was performed using the standard protocol following bolus administration of intravenous  contrast. CONTRAST:  OMNIPAQUE IOHEXOL 300 MG/ML  SOLN COMPARISON:  July 13, 2018. FINDINGS: Lower chest: No acute abnormality. Hepatobiliary: No focal liver abnormality is seen. No gallstones or biliary dilatation. Grossly stable 8 mm enhancing focus is seen within the gallbladder fundus. Pancreas: Unremarkable. No pancreatic ductal dilatation or surrounding inflammatory changes. Spleen: Normal in size without focal abnormality. Adrenals/Urinary Tract: Adrenal glands are unremarkable. Kidneys are normal, without renal calculi, focal lesion, or hydronephrosis. Bladder is unremarkable. Stomach/Bowel: The stomach appears normal. There is no evidence of bowel obstruction or inflammation. Status post appendectomy. Vascular/Lymphatic: No significant vascular findings are present. No enlarged abdominal or pelvic lymph nodes. Reproductive: Uterus and bilateral adnexa are unremarkable. Other: No abdominal wall hernia or abnormality. No abdominopelvic ascites. Musculoskeletal: No acute or significant osseous findings. IMPRESSION: Stable 8 mm enhancing focus is seen involving the gallbladder fundus. Further evaluation with ultrasound is recommended. No other abnormality seen in the abdomen or pelvis. Electronically Signed   By: Lupita Raider M.D.   On: 02/22/2019 17:54    Pt is afebrile and non-toxic appearing.  No further vomiting here and she has tolerated oral fluids. Pain improved after medications.  I have discussed CT findings with the patient and she understands that she will need further evaluation by ultrasound for her gallbladder.  Her pain is improved and I feel that she is appropriate  for discharge home.  I have scheduled her for [place] of abomen for tomorrow, clear fluids tonight.  Strict return precautions discussed.      Kem Parkinson, PA-C 02/22/19 2319    Dorie Rank, MD 02/23/19 1055

## 2019-02-22 NOTE — Discharge Instructions (Signed)
You are scheduled to return here tomorrow to have an ultrasound of your gallbladder.  Call [phone] tomorrow morning to schedule the appointment time.  Tonight, small frequent sips of clear fluids.  You may take the Percocet, 1 tablet every 4 hours as needed for pain.

## 2019-02-22 NOTE — ED Triage Notes (Signed)
Pt with abd pain with back pain as well.  Pt with emesis.  Ongoing for past 2 days.

## 2019-02-23 ENCOUNTER — Ambulatory Visit (HOSPITAL_COMMUNITY)
Admission: RE | Admit: 2019-02-23 | Discharge: 2019-02-23 | Disposition: A | Discharge status: Home / Self Care | Payer: Self-pay | Source: Ambulatory Visit | Attending: Emergency Medicine | Admitting: Emergency Medicine

## 2019-02-23 ENCOUNTER — Other Ambulatory Visit: Payer: Self-pay

## 2019-02-23 ENCOUNTER — Encounter (HOSPITAL_COMMUNITY): Payer: Self-pay | Admitting: Emergency Medicine

## 2019-02-23 ENCOUNTER — Emergency Department (HOSPITAL_COMMUNITY)
Admission: EM | Admit: 2019-02-23 | Discharge: 2019-02-23 | Disposition: A | Discharge status: Home / Self Care | Payer: Self-pay | Attending: Emergency Medicine | Admitting: Emergency Medicine

## 2019-02-23 DIAGNOSIS — N3000 Acute cystitis without hematuria: Secondary | ICD-10-CM

## 2019-02-23 DIAGNOSIS — R1010 Upper abdominal pain, unspecified: Secondary | ICD-10-CM | POA: Insufficient documentation

## 2019-02-23 DIAGNOSIS — F17210 Nicotine dependence, cigarettes, uncomplicated: Secondary | ICD-10-CM | POA: Insufficient documentation

## 2019-02-23 DIAGNOSIS — R1013 Epigastric pain: Secondary | ICD-10-CM

## 2019-02-23 DIAGNOSIS — Z79899 Other long term (current) drug therapy: Secondary | ICD-10-CM | POA: Insufficient documentation

## 2019-02-23 LAB — URINALYSIS, ROUTINE W REFLEX MICROSCOPIC
Bilirubin Urine: NEGATIVE
Glucose, UA: NEGATIVE mg/dL
Ketones, ur: NEGATIVE mg/dL
Nitrite: POSITIVE — AB
Protein, ur: NEGATIVE mg/dL
Specific Gravity, Urine: 1.02 (ref 1.005–1.030)
pH: 6 (ref 5.0–8.0)

## 2019-02-23 MED ORDER — FAMOTIDINE 20 MG PO TABS
20.0000 mg | ORAL_TABLET | Freq: Once | ORAL | Status: AC
  Administered 2019-02-23: 20 mg via ORAL
  Filled 2019-02-23: qty 1

## 2019-02-23 MED ORDER — ONDANSETRON 4 MG PO TBDP
4.0000 mg | ORAL_TABLET | Freq: Once | ORAL | Status: AC
  Administered 2019-02-23: 4 mg via ORAL
  Filled 2019-02-23: qty 1

## 2019-02-23 MED ORDER — FAMOTIDINE 20 MG PO TABS: 20.0000 mg | ORAL_TABLET | Freq: Two times a day (BID) | ORAL | 0 refills | Status: DC

## 2019-02-23 MED ORDER — CEPHALEXIN 500 MG PO CAPS: 500.0000 mg | ORAL_CAPSULE | Freq: Four times a day (QID) | ORAL | 0 refills | Status: DC

## 2019-02-23 MED ORDER — CEPHALEXIN 500 MG PO CAPS
500.0000 mg | ORAL_CAPSULE | Freq: Once | ORAL | Status: AC
  Administered 2019-02-23: 500 mg via ORAL
  Filled 2019-02-23: qty 1

## 2019-02-23 MED ORDER — HYDROCODONE-ACETAMINOPHEN 5-325 MG PO TABS: 1.0000 | ORAL_TABLET | ORAL | 0 refills | Status: DC | PRN

## 2019-02-23 NOTE — ED Triage Notes (Signed)
Patient reports she has had continuing pain and inability to keep anything down. Out of the pain medication she was given yesterday. Patient had [place] this am.

## 2019-02-23 NOTE — ED Provider Notes (Signed)
[facility] EMERGENCY DEPARTMENT Provider Note   CSN: [id number] Arrival date & time: 02/23/19  1824     History Chief Complaint  Patient presents with  . Abdominal Pain    Kelli Hoover is a 29 y.o. female.  Patient presents with persistent epigastric and low back pain.  Patient seen yesterday had CT scan of the abdomen raise some concerns about a mass at the ampulla of the gallbladder.  Ultrasound showed no evidence of that.  Patient's labs yesterday were significant for a mild leukocytosis.  No significant liver function test abnormalities.  And a urinalysis that was nitrite positive.  Covid testing was negative pregnancy test was negative.  Patient states she was not sent home with any medications or prescriptions.  She did have an ultrasound arranged for this morning which was done as stated above.  And that did not show any evidence of any gallbladder or common bile duct abnormalities no evidence of any mass.  So unlikely that there is a mass.  Patient states she tried to follow-up with gastroenterology here locally but they were requiring money to see her.        Past Medical History:  Diagnosis Date  . Migraine   . Miscarriage   . Panic attack     Patient Active Problem List   Diagnosis Date Noted  . Acute appendicitis   . GAD (generalized anxiety disorder) 07/20/2014    Past Surgical History:  Procedure Laterality Date  . DILATION AND CURETTAGE OF UTERUS    . INDUCED ABORTION    . LAPAROSCOPIC APPENDECTOMY N/A 07/13/2018   Procedure: LAPAROSCOPIC APPENDECTOMY;  Surgeon: Aviva Signs, MD;  Location: [facility];  Service: General;  Laterality: N/A;     OB History    Gravida  2   Para      Term      Preterm      AB      Living        SAB      TAB      Ectopic      Multiple      Live Births              Family History  Problem Relation Age of Onset  . Hypertension Mother   . Anxiety disorder Mother   . Cancer Other   . Hypertension Other    . Anxiety disorder Sister   . Anxiety disorder Maternal Grandmother     Social History   Tobacco Use  . Smoking status: Current Every Day Smoker    Packs/day: 1.00    Years: 7.00    Pack years: 7.00    Types: Cigarettes  . Smokeless tobacco: Never Used  Substance Use Topics  . Alcohol use: Yes    Comment: occasionally  . Drug use: No    Home Medications Prior to Admission medications   Medication Sig Start Date End Date Taking? Authorizing Provider  ondansetron (ZOFRAN) 4 MG tablet Take 1 tablet (4 mg total) by mouth every 8 (eight) hours as needed for nausea or vomiting. 02/22/19  Yes Triplett, Tammy, PA-C  cephALEXin (KEFLEX) 500 MG capsule Take 1 capsule (500 mg total) by mouth 4 (four) times daily. 02/23/19   Fredia Sorrow, MD  famotidine (PEPCID) 20 MG tablet Take 1 tablet (20 mg total) by mouth 2 (two) times daily. 02/23/19   Fredia Sorrow, MD  HYDROcodone-acetaminophen (NORCO/VICODIN) 5-325 MG tablet Take 1 tablet by mouth every 4 (four) hours as needed.  02/23/19   Vanetta Mulders, MD  pantoprazole (PROTONIX) 40 MG tablet Take 1 tablet (40 mg total) by mouth daily. 02/22/19   Triplett, Tammy, PA-C  promethazine (PHENERGAN) 25 MG tablet Take 1 tablet (25 mg total) by mouth every 6 (six) hours as needed for nausea or vomiting. 02/22/19   Triplett, Tammy, PA-C    Allergies    Erythromycin  Review of Systems   Review of Systems  Constitutional: Negative for chills and fever.  HENT: Negative for rhinorrhea and sore throat.   Eyes: Negative for visual disturbance.  Respiratory: Negative for cough and shortness of breath.   Cardiovascular: Negative for chest pain and leg swelling.  Gastrointestinal: Positive for abdominal pain and nausea. Negative for diarrhea and vomiting.  Genitourinary: Negative for dysuria.  Musculoskeletal: Positive for back pain. Negative for neck pain.  Skin: Negative for rash.  Neurological: Negative for dizziness, light-headedness and headaches.    Hematological: Does not bruise/bleed easily.  Psychiatric/Behavioral: Negative for confusion.    Physical Exam Updated Vital Signs BP (!) 118/101 (BP Location: Right Arm)   Pulse 89   Temp 98.4 F (36.9 C) (Oral)   Resp 17   Ht 1.524 m (5')   Wt 57.2 kg   LMP 02/17/2019   SpO2 99%   BMI 24.61 kg/m   Physical Exam Vitals and nursing note reviewed.  Constitutional:      General: She is not in acute distress.    Appearance: She is well-developed.  HENT:     Head: Normocephalic and atraumatic.  Eyes:     Conjunctiva/sclera: Conjunctivae normal.  Cardiovascular:     Rate and Rhythm: Normal rate and regular rhythm.     Heart sounds: No murmur.  Pulmonary:     Effort: Pulmonary effort is normal. No respiratory distress.     Breath sounds: Normal breath sounds.  Abdominal:     Palpations: Abdomen is soft.     Tenderness: There is no abdominal tenderness. There is no guarding.  Musculoskeletal:        General: Normal range of motion.     Cervical back: Normal range of motion and neck supple.  Skin:    General: Skin is warm and dry.     Capillary Refill: Capillary refill takes less than 2 seconds.  Neurological:     General: No focal deficit present.     Mental Status: She is alert and oriented to person, place, and time.     ED Results / Procedures / Treatments   Labs (all labs ordered are listed, but only abnormal results are displayed) Labs Reviewed  URINALYSIS, ROUTINE W REFLEX MICROSCOPIC - Abnormal; Notable for the following components:      Result Value   APPearance HAZY (*)    Hgb urine dipstick SMALL (*)    Nitrite POSITIVE (*)    Leukocytes,Ua TRACE (*)    Bacteria, UA MANY (*)    All other components within normal limits    EKG None  Radiology CT ABDOMEN PELVIS W CONTRAST  Result Date: 02/22/2019 CLINICAL DATA:  Acute generalized abdominal pain.  Vomiting. EXAM: CT ABDOMEN AND PELVIS WITH CONTRAST TECHNIQUE: Multidetector CT imaging of the abdomen  and pelvis was performed using the standard protocol following bolus administration of intravenous contrast. CONTRAST:  OMNIPAQUE IOHEXOL 300 MG/ML  SOLN COMPARISON:  July 13, 2018. FINDINGS: Lower chest: No acute abnormality. Hepatobiliary: No focal liver abnormality is seen. No gallstones or biliary dilatation. Grossly stable 8 mm enhancing focus is  seen within the gallbladder fundus. Pancreas: Unremarkable. No pancreatic ductal dilatation or surrounding inflammatory changes. Spleen: Normal in size without focal abnormality. Adrenals/Urinary Tract: Adrenal glands are unremarkable. Kidneys are normal, without renal calculi, focal lesion, or hydronephrosis. Bladder is unremarkable. Stomach/Bowel: The stomach appears normal. There is no evidence of bowel obstruction or inflammation. Status post appendectomy. Vascular/Lymphatic: No significant vascular findings are present. No enlarged abdominal or pelvic lymph nodes. Reproductive: Uterus and bilateral adnexa are unremarkable. Other: No abdominal wall hernia or abnormality. No abdominopelvic ascites. Musculoskeletal: No acute or significant osseous findings. IMPRESSION: Stable 8 mm enhancing focus is seen involving the gallbladder fundus. Further evaluation with ultrasound is recommended. No other abnormality seen in the abdomen or pelvis. Electronically Signed   By: Lupita Raider M.D.   On: 02/22/2019 17:54   US Abdomen Limited RUQ/Gall Gladder  Result Date: 02/23/2019 CLINICAL DATA:  Upper abdominal pain for 2 days, abnormal CT exam EXAM: ULTRASOUND ABDOMEN LIMITED RIGHT UPPER QUADRANT COMPARISON:  None. FINDINGS: Gallbladder: Normally distended without stones or wall thickening. No pericholecystic fluid or sonographic Murphy sign. No definite gallbladder mass identified; specifically, no fundal lesion is detected sonographically Common bile duct: Diameter: 3 mm, normal Liver: Normal echogenicity. No mass or nodularity. Portal vein is patent on color  Doppler imaging with normal direction of blood flow towards the liver. Other: No RIGHT upper quadrant free fluid. IMPRESSION: Negative RIGHT upper quadrant ultrasound. Specifically, no definite fundal nodule/thickening/stone is identified despite targeted imaging. This may represent a gallbladder polyp or focal pathology like adenomyomatosis. Since this cannot be followed by ultrasound, recommend follow-up CT imaging in 1 year to confirm stability. Electronically Signed   By: Ulyses Southward M.D.   On: 02/23/2019 10:29    Procedures Procedures (including critical care time)  Medications Ordered in ED Medications  famotidine (PEPCID) tablet 20 mg (20 mg Oral Given 02/23/19 2132)  cephALEXin (KEFLEX) capsule 500 mg (500 mg Oral Given 02/23/19 2132)  ondansetron (ZOFRAN-ODT) disintegrating tablet 4 mg (4 mg Oral Given 02/23/19 2136)    ED Course  I have reviewed the triage vital signs and the nursing notes.  Pertinent labs & imaging results that were available during my care of the patient were reviewed by me and considered in my medical decision making (see chart for details).    MDM Rules/Calculators/A&P                     Patient with a fairly significant work-up with a CT scan of the abdomen being negative other than the questionable ampulla mass in the gallbladder.  But that was not seen at all on ultrasound so unlikely that is there.  Patient was not treated with antibiotics for nitrite positive urinalysis suggestive of urinary tract infection I do not feel that that is the cause of all her symptoms however.  But she does need to be treated.  She will be given a dose of Keflex here and prescription for Keflex.  Patient given a good Rx card.  Also reasonable to have patient take Pepcid for 2 weeks.  If she improves and this is probably secondary to a stomach ulcer.  If not she will need to follow-up with gastroenterology.  In addition the dysfunctional gallbladder is not completely ruled out.  For  consideration for HIDA scan through gastroenterology is also appropriate.  It appears that patient is going to need to get access to gastroenterology through [facility] system.  That information provided to  her.  In addition it appears that there was a prescription put in for pain medication yesterday database shows that she did not get this filled.  Have canceled that prescription and will give her a prepack here to take home of 6 tablets of hydrocodone.  Database was reviewed.  Patient nontoxic no acute distress.  Not febrile not tachycardic not tachypneic oxygen sats are good blood pressures are fine.       Final Clinical Impression(s) / ED Diagnoses Final diagnoses:  Epigastric pain  Acute cystitis without hematuria    Rx / DC Orders ED Discharge Orders         Ordered    cephALEXin (KEFLEX) 500 MG capsule  4 times daily     02/23/19 2129    famotidine (PEPCID) 20 MG tablet  2 times daily     02/23/19 2129    HYDROcodone-acetaminophen (NORCO/VICODIN) 5-325 MG tablet  Every 4 hours PRN     02/23/19 2130           Vanetta Mulders, MD 02/23/19 2142

## 2019-02-23 NOTE — Discharge Instructions (Addendum)
Take the Pepcid as directed for the next 2 weeks.  This may heal a stomach ulcer if you have that.  Also urinalysis yesterday was suggestive of urinary tract infection so take the antibiotic Keflex as directed for the next 7 days.  If you do not improve on the Pepcid then follow-up with gastroenterology for considerations for an upper endoscopy and then may be a specialized HIDA scan of your gallbladder to see if you have a dysfunctional gallbladder.  We know you have no gallstones.  But a dysfunctional gallbladder can cause pain.  We would probably need to follow-up with gastroenterology through [facility] in [place].

## 2019-02-24 MED FILL — Hydrocodone-Acetaminophen Tab 5-325 MG: ORAL | Qty: 6 | Status: AC

## 2019-02-25 ENCOUNTER — Other Ambulatory Visit: Payer: Self-pay

## 2019-02-25 ENCOUNTER — Telehealth: Payer: Self-pay | Admitting: General Practice

## 2019-02-25 ENCOUNTER — Encounter (HOSPITAL_COMMUNITY): Payer: Self-pay

## 2019-02-25 ENCOUNTER — Emergency Department (HOSPITAL_COMMUNITY)
Admission: EM | Admit: 2019-02-25 | Discharge: 2019-02-25 | Disposition: A | Discharge status: Home / Self Care | Payer: Self-pay | Attending: Emergency Medicine | Admitting: Emergency Medicine

## 2019-02-25 DIAGNOSIS — F17210 Nicotine dependence, cigarettes, uncomplicated: Secondary | ICD-10-CM | POA: Insufficient documentation

## 2019-02-25 DIAGNOSIS — R1013 Epigastric pain: Secondary | ICD-10-CM | POA: Insufficient documentation

## 2019-02-25 DIAGNOSIS — R112 Nausea with vomiting, unspecified: Secondary | ICD-10-CM | POA: Insufficient documentation

## 2019-02-25 LAB — URINE CULTURE: Culture: >=100000 — AB

## 2019-02-25 LAB — CBC WITH DIFFERENTIAL/PLATELET
Abs Immature Granulocytes: 0.02 10*3/uL (ref 0.00–0.07)
Basophils Absolute: 0 10*3/uL (ref 0.0–0.1)
Basophils Relative: 0 %
Eosinophils Absolute: 0.2 10*3/uL (ref 0.0–0.5)
Eosinophils Relative: 2 %
HCT: 45.8 % (ref 36.0–46.0)
Hemoglobin: 15.6 g/dL — ABNORMAL HIGH (ref 12.0–15.0)
Immature Granulocytes: 0 %
Lymphocytes Relative: 27 %
Lymphs Abs: 2.5 10*3/uL (ref 0.7–4.0)
MCH: 30.2 pg (ref 26.0–34.0)
MCHC: 34.1 g/dL (ref 30.0–36.0)
MCV: 88.8 fL (ref 80.0–100.0)
Monocytes Absolute: 0.7 10*3/uL (ref 0.1–1.0)
Monocytes Relative: 7 %
Neutro Abs: 6 10*3/uL (ref 1.7–7.7)
Neutrophils Relative %: 64 %
Platelets: 244 10*3/uL (ref 150–400)
RBC: 5.16 MIL/uL — ABNORMAL HIGH (ref 3.87–5.11)
RDW: 13.6 % (ref 11.5–15.5)
WBC: 9.4 10*3/uL (ref 4.0–10.5)
nRBC: 0 % (ref 0.0–0.2)

## 2019-02-25 LAB — COMPREHENSIVE METABOLIC PANEL
ALT: 11 U/L (ref 0–44)
AST: 10 U/L — ABNORMAL LOW (ref 15–41)
Albumin: 4.1 g/dL (ref 3.5–5.0)
Alkaline Phosphatase: 88 U/L (ref 38–126)
Anion gap: 8 (ref 5–15)
BUN: 10 mg/dL (ref 6–20)
CO2: 23 mmol/L (ref 22–32)
Calcium: 8.6 mg/dL — ABNORMAL LOW (ref 8.9–10.3)
Chloride: 107 mmol/L (ref 98–111)
Creatinine, Ser: 0.56 mg/dL (ref 0.44–1.00)
GFR calc Af Amer: >60 mL/min (ref >60)
GFR calc non Af Amer: >60 mL/min (ref >60)
Glucose, Bld: 111 mg/dL — ABNORMAL HIGH (ref 70–99)
Potassium: 3.1 mmol/L — ABNORMAL LOW (ref 3.5–5.1)
Sodium: 138 mmol/L (ref 135–145)
Total Bilirubin: 0.7 mg/dL (ref 0.3–1.2)
Total Protein: 7.5 g/dL (ref 6.5–8.1)

## 2019-02-25 LAB — LIPASE, BLOOD: Lipase: 31 U/L (ref 11–51)

## 2019-02-25 MED ORDER — HYDROCODONE-ACETAMINOPHEN 5-325 MG PO TABS: 1.0000 | ORAL_TABLET | ORAL | 0 refills | Status: DC | PRN

## 2019-02-25 MED ORDER — ALUM & MAG HYDROXIDE-SIMETH 200-200-20 MG/5ML PO SUSP
30.0000 mL | Freq: Once | ORAL | Status: AC
  Administered 2019-02-25: 30 mL via ORAL
  Filled 2019-02-25: qty 30

## 2019-02-25 MED ORDER — ONDANSETRON HCL 4 MG/2ML IJ SOLN
4.0000 mg | Freq: Once | INTRAMUSCULAR | Status: AC
  Administered 2019-02-25: 4 mg via INTRAVENOUS
  Filled 2019-02-25: qty 2

## 2019-02-25 MED ORDER — MORPHINE SULFATE (PF) 2 MG/ML IV SOLN
2.0000 mg | Freq: Once | INTRAVENOUS | Status: AC
  Administered 2019-02-25: 14:00:00 2 mg via INTRAVENOUS
  Filled 2019-02-25: qty 1

## 2019-02-25 MED ORDER — SODIUM CHLORIDE 0.9 % IV BOLUS
1000.0000 mL | Freq: Once | INTRAVENOUS | Status: AC
  Administered 2019-02-25: 1000 mL via INTRAVENOUS

## 2019-02-25 MED ORDER — PROMETHAZINE HCL 25 MG PO TABS: 25.0000 mg | ORAL_TABLET | Freq: Four times a day (QID) | ORAL | 0 refills | Status: DC | PRN

## 2019-02-25 NOTE — Telephone Encounter (Signed)
Ok to schedule appointment.

## 2019-02-25 NOTE — Telephone Encounter (Signed)
ER REFERRAL  

## 2019-02-25 NOTE — ED Triage Notes (Signed)
Pt presents to ED with continuing abdominal pain x 4 days. Pt states GI is suppose to call her next week but unable to wait.

## 2019-02-25 NOTE — ED Provider Notes (Signed)
[facility] EMERGENCY DEPARTMENT Provider Note   CSN: [id number] Arrival date & time: 02/25/19  8016     History Chief Complaint  Patient presents with  . Abdominal Pain    Kelli Hoover is a 29 y.o. female with no significant past medical history presenting with a now 10-day history of upper abdominal pain which she describes as burning in character, constant and worsened with any attempts at p.o. intake.  This is her third visit back with this complaint.  She was initially seen on February 2 for this complaint at which time CT imaging and laboratory tests were negative for any acute findings.  There was a questionable gallbladder mass per CT imaging, outpatient ultrasound was negative for any acute findings with no sign of mass.  At a repeat visit on February 3 she was started on Keflex for her UTI but continues to have epigastric pain.  She has not started the Pepcid, stating she was unable to afford both the Keflex and the Pepcid.  She is also attempted to get in with local GI which was met with financial/lack of insurance issues.  She has been referred to [facility] hospital for GI follow-up which she has not yet established.  The history is provided by the patient.       Past Medical History:  Diagnosis Date  . Migraine   . Miscarriage   . Panic attack     Patient Active Problem List   Diagnosis Date Noted  . Acute appendicitis   . GAD (generalized anxiety disorder) 07/20/2014    Past Surgical History:  Procedure Laterality Date  . DILATION AND CURETTAGE OF UTERUS    . INDUCED ABORTION    . LAPAROSCOPIC APPENDECTOMY N/A 07/13/2018   Procedure: LAPAROSCOPIC APPENDECTOMY;  Surgeon: Aviva Signs, MD;  Location: [facility];  Service: General;  Laterality: N/A;     OB History    Gravida  2   Para      Term      Preterm      AB      Living        SAB      TAB      Ectopic      Multiple      Live Births              Family History  Problem  Relation Age of Onset  . Hypertension Mother   . Anxiety disorder Mother   . Cancer Other   . Hypertension Other   . Anxiety disorder Sister   . Anxiety disorder Maternal Grandmother     Social History   Tobacco Use  . Smoking status: Current Every Day Smoker    Packs/day: 1.00    Years: 7.00    Pack years: 7.00    Types: Cigarettes  . Smokeless tobacco: Never Used  Substance Use Topics  . Alcohol use: Yes    Comment: occasionally  . Drug use: No    Home Medications Prior to Admission medications   Medication Sig Start Date End Date Taking? Authorizing Provider  cephALEXin (KEFLEX) 500 MG capsule Take 1 capsule (500 mg total) by mouth 4 (four) times daily. 02/23/19  Yes Fredia Sorrow, MD  famotidine (PEPCID) 20 MG tablet Take 1 tablet (20 mg total) by mouth 2 (two) times daily. 02/23/19  Yes Fredia Sorrow, MD  ondansetron (ZOFRAN) 4 MG tablet Take 1 tablet (4 mg total) by mouth every 8 (eight) hours as needed for  nausea or vomiting. 02/22/19  Yes Triplett, Tammy, PA-C  HYDROcodone-acetaminophen (NORCO/VICODIN) 5-325 MG tablet Take 1 tablet by mouth every 4 (four) hours as needed. 02/25/19   Evalee Jefferson, PA-C  pantoprazole (PROTONIX) 40 MG tablet Take 1 tablet (40 mg total) by mouth daily. 02/22/19   Triplett, Tammy, PA-C  promethazine (PHENERGAN) 25 MG tablet Take 1 tablet (25 mg total) by mouth every 6 (six) hours as needed for nausea or vomiting. 02/25/19   Evalee Jefferson, PA-C    Allergies    Erythromycin  Review of Systems   Review of Systems  Constitutional: Negative for chills and fever.  HENT: Negative for congestion and sore throat.   Eyes: Negative.   Respiratory: Negative for chest tightness and shortness of breath.   Cardiovascular: Negative for chest pain.  Gastrointestinal: Positive for abdominal pain, nausea and vomiting.  Genitourinary: Negative.   Musculoskeletal: Negative for arthralgias, joint swelling and neck pain.  Skin: Negative.  Negative for rash and  wound.  Neurological: Negative for dizziness, weakness, light-headedness, numbness and headaches.  Psychiatric/Behavioral: Negative.     Physical Exam Updated Vital Signs BP 136/88 (BP Location: Right Arm)   Pulse 82   Temp 97.9 F (36.6 C) (Oral)   Resp 16   Ht 5' (1.524 m)   Wt 59 kg   LMP 02/17/2019   SpO2 99%   BMI 25.39 kg/m   Physical Exam Vitals and nursing note reviewed.  Constitutional:      Appearance: She is well-developed.  HENT:     Head: Normocephalic and atraumatic.  Eyes:     Conjunctiva/sclera: Conjunctivae normal.  Cardiovascular:     Rate and Rhythm: Normal rate and regular rhythm.     Heart sounds: Normal heart sounds.  Pulmonary:     Effort: Pulmonary effort is normal.     Breath sounds: Normal breath sounds. No wheezing.  Abdominal:     General: Bowel sounds are normal.     Palpations: Abdomen is soft.     Tenderness: There is abdominal tenderness in the epigastric area. There is no guarding. Negative signs include Murphy's sign.  Musculoskeletal:        General: Normal range of motion.     Cervical back: Normal range of motion.  Skin:    General: Skin is warm and dry.  Neurological:     Mental Status: She is alert and oriented to person, place, and time.     ED Results / Procedures / Treatments   Labs (all labs ordered are listed, but only abnormal results are displayed) Labs Reviewed  CBC WITH DIFFERENTIAL/PLATELET - Abnormal; Notable for the following components:      Result Value   RBC 5.16 (*)    Hemoglobin 15.6 (*)    All other components within normal limits  COMPREHENSIVE METABOLIC PANEL - Abnormal; Notable for the following components:   Potassium 3.1 (*)    Glucose, Bld 111 (*)    Calcium 8.6 (*)    AST 10 (*)    All other components within normal limits  LIPASE, BLOOD    EKG None  Radiology No results found.  Procedures Procedures (including critical care time)  Medications Ordered in ED Medications    ondansetron (ZOFRAN) injection 4 mg (4 mg Intravenous Given 02/25/19 1140)  sodium chloride 0.9 % bolus 1,000 mL (0 mLs Intravenous Stopped 02/25/19 1352)  alum & mag hydroxide-simeth (MAALOX/MYLANTA) 200-200-20 MG/5ML suspension 30 mL (30 mLs Oral Given 02/25/19 1141)  morphine 2 MG/ML injection  2 mg (2 mg Intravenous Given 02/25/19 1352)    ED Course  I have reviewed the triage vital signs and the nursing notes.  Pertinent labs & imaging results that were available during my care of the patient were reviewed by me and considered in my medical decision making (see chart for details).    MDM Rules/Calculators/A&P                      Pt with persistent epigastric pain, n/v without acute abd findings on todays exam.  Labs were repeated and stable with no elevation in liver or pancreatic enzymes. Normal wbc count.  Pt may need HIDA scan for further evaluation of gb function.  No murphy's sign on todays exam.  She was given IV fluids, zofran , dose of maalox which did not relieve her burning pain sensation, but nausea resolved.  She tolerated PO intake.  No indication for further or repeat imaging on todays presentation.  She was strongly encouraged to contact [facility] or the local GI group for f/u care.  Phenergan prescribed as less expensive than zofran, prescribed a few hydrocodone tabs after review of the [place] narcotic database.  Pt discussed with Dr. Langston Masker prior to dc home.  Final Clinical Impression(s) / ED Diagnoses Final diagnoses:  Epigastric pain  Non-intractable vomiting with nausea, unspecified vomiting type    Rx / DC Orders ED Discharge Orders         Ordered    promethazine (PHENERGAN) 25 MG tablet  Every 6 hours PRN     02/25/19 1620    HYDROcodone-acetaminophen (NORCO/VICODIN) 5-325 MG tablet  Every 4 hours PRN     02/25/19 1620           Evalee Jefferson, PA-C 02/26/19 9872    Wyvonnia Dusky, MD 02/26/19 1012

## 2019-02-25 NOTE — Telephone Encounter (Signed)
Called patient and she is scheduled for Monday

## 2019-02-25 NOTE — Discharge Instructions (Addendum)
Your lab tests today are stable.  You may continue to use the medications for control of your symptoms. Do not drive within 4 hours of taking hydrocodone as this will make you drowsy.  Use caution with the phenergan as this also can make you sleepy.  Per your prior visit, it is recommended that you receive follow up gastroenterology care as you may need an additional test to make sure your gallbladder is functioning properly (a Hida scan), however your labs today and your prior ultrasound confirms you do not have gallstones or an acute infection in your gallbladder.  You will benefit from taking an acid reducer and strongly recommend you getting the pepcid prescribed at your last visit.  You may get this cheaper as on over the counter version - generic famotidine.

## 2019-02-28 ENCOUNTER — Ambulatory Visit: Payer: Self-pay | Admitting: Gastroenterology

## 2019-02-28 ENCOUNTER — Other Ambulatory Visit: Payer: Self-pay

## 2019-02-28 ENCOUNTER — Encounter: Payer: Self-pay | Admitting: Gastroenterology

## 2019-02-28 VITALS — BP 117/91 | HR 101 | Temp 96.9°F | Ht 60.0 in | Wt 103.6 lb

## 2019-02-28 DIAGNOSIS — K828 Other specified diseases of gallbladder: Secondary | ICD-10-CM

## 2019-02-28 DIAGNOSIS — R1011 Right upper quadrant pain: Secondary | ICD-10-CM

## 2019-02-28 DIAGNOSIS — K5900 Constipation, unspecified: Secondary | ICD-10-CM | POA: Insufficient documentation

## 2019-02-28 DIAGNOSIS — R112 Nausea with vomiting, unspecified: Secondary | ICD-10-CM

## 2019-02-28 MED ORDER — LINACLOTIDE 290 MCG PO CAPS: 290.0000 ug | ORAL_CAPSULE | Freq: Every day | ORAL | 0 refills | Status: DC | PRN

## 2019-02-28 MED ORDER — DEXILANT 60 MG PO CPDR: 60.0000 mg | DELAYED_RELEASE_CAPSULE | Freq: Every day | ORAL | 0 refills | Status: DC

## 2019-02-28 NOTE — Assessment & Plan Note (Signed)
Likely secondary to diminished oral intake and hydrocodone use.  Samples of Linzess 290 mcg to take once daily as needed.

## 2019-02-28 NOTE — Progress Notes (Signed)
Primary Care Physician:  Patient, No Pcp Per  Primary Gastroenterologist:  Roetta Sessions, MD   Chief Complaint  Patient presents with  . Abdominal Pain    abd pain under breast radiating to back    HPI:  Kelli Hoover is a 29 y.o. female here for further evaluation of abdominal pain.  Patient was seen in the emergency department several times this month for the same.  Patient states pain started about 1 week ago.  She woke up in the middle the night with right upper quadrant pain and could not get back to sleep.  The following day she went to the ED on February 22, 2019.  Pain has been constant although has been worse with meals.  Finds it hard to eat.  She has had some nausea and vomiting.  Prior to episode of abdominal pain she had no issues with her bowels.  She has had more constipation as she is unable to eat.  She is also on pain medication.  She is having 1 bowel movement in the past week.  Little bit of bright red blood on one occasion.  No melena.  Stools are dark but she has been on Pepto.  As far as medications he provided at the ED, she was unable to afford the Protonix.  She has been on famotidine twice daily for about 3 to 4 days but does not appreciate any significant improvement.  She was able to complete Keflex for UTI.    ASA powders up until two months ago would take couple a day.  Continues on ibuprofen for headaches and for this abdominal pain.  CT abdomen pelvis with contrast on February 22, 2019: Grossly stable 8 mm enhancing focus is seen within the gallbladder fundus.  Right upper quadrant ultrasound on February 23, 2019: No definite gallbladder fundus mass.  May represent a gallbladder polyp or focal pathology like adenomyomatosis.  Since this cannot be seen by ultrasound, recommended follow-up CT in 1 year to confirm stability.  She underwent appendectomy back in June after presenting with acute appendicitis.  CT at that time July 13, 2018: Also showed enhancing focus  of increased attenuation within the fundus of the gallbladder measuring 7 x 5 mm.  Recent labs with normal lipase, LFTs.  She had mild leukocytosis initially 6 days ago at 13,300, 3 days ago was normal.  She was found to have a UTI.  Urine pregnancy test negative on February 22, 2019.     Current Outpatient Medications  Medication Sig Dispense Refill  . cephALEXin (KEFLEX) 500 MG capsule Take 1 capsule (500 mg total) by mouth 4 (four) times daily. 28 capsule 0  . famotidine (PEPCID) 20 MG tablet Take 1 tablet (20 mg total) by mouth 2 (two) times daily. 30 tablet 0  . HYDROcodone-acetaminophen (NORCO/VICODIN) 5-325 MG tablet Take 1 tablet by mouth every 4 (four) hours as needed. 10 tablet 0   No current facility-administered medications for this visit.    Allergies as of 02/28/2019 - Review Complete 02/28/2019  Allergen Reaction Noted  . Erythromycin Shortness Of Breath and Palpitations 03/25/2012    Past Medical History:  Diagnosis Date  . Migraine   . Miscarriage   . Panic attack     Past Surgical History:  Procedure Laterality Date  . DILATION AND CURETTAGE OF UTERUS    . INDUCED ABORTION    . LAPAROSCOPIC APPENDECTOMY N/A 07/13/2018   Procedure: LAPAROSCOPIC APPENDECTOMY;  Surgeon: Franky Macho, MD;  Location: [facility];  Service: General;  Laterality: N/A;    Family History  Problem Relation Age of Onset  . Hypertension Mother   . Anxiety disorder Mother   . Cancer Other   . Hypertension Other   . Anxiety disorder Sister   . Anxiety disorder Maternal Grandmother   . Colon cancer Maternal Grandmother        age greater than 33  . Ulcers Paternal Aunt     Social History   Socioeconomic History  . Marital status: Single    Spouse name: Not on file  . Number of children: Not on file  . Years of education: Not on file  . Highest education level: Not on file  Occupational History  . Not on file  Tobacco Use  . Smoking status: Current Every Day Smoker    Packs/day:  1.00    Years: 7.00    Pack years: 7.00    Types: Cigarettes  . Smokeless tobacco: Never Used  Substance and Sexual Activity  . Alcohol use: Not Currently  . Drug use: No  . Sexual activity: Yes    Birth control/protection: None  Other Topics Concern  . Not on file  Social History Narrative  . Not on file   Social Determinants of Health   Financial Resource Strain:   . Difficulty of Paying Living Expenses: Not on file  Food Insecurity:   . Worried About Charity fundraiser in the Last Year: Not on file  . Ran Out of Food in the Last Year: Not on file  Transportation Needs:   . Lack of Transportation (Medical): Not on file  . Lack of Transportation (Non-Medical): Not on file  Physical Activity:   . Days of Exercise per Week: Not on file  . Minutes of Exercise per Session: Not on file  Stress:   . Feeling of Stress : Not on file  Social Connections:   . Frequency of Communication with Friends and Family: Not on file  . Frequency of Social Gatherings with Friends and Family: Not on file  . Attends Religious Services: Not on file  . Active Member of Clubs or Organizations: Not on file  . Attends Archivist Meetings: Not on file  . Marital Status: Not on file  Intimate Partner Violence:   . Fear of Current or Ex-Partner: Not on file  . Emotionally Abused: Not on file  . Physically Abused: Not on file  . Sexually Abused: Not on file      ROS:  General: Negative for anorexia, weight loss, fever, chills, fatigue, weakness. Eyes: Negative for vision changes.  ENT: Negative for hoarseness, difficulty swallowing , nasal congestion. CV: Negative for chest pain, angina, palpitations, dyspnea on exertion, peripheral edema.  Respiratory: Negative for dyspnea at rest, dyspnea on exertion, cough, sputum, wheezing.  GI: See history of present illness. GU:  Negative for dysuria, hematuria, urinary incontinence, urinary frequency, nocturnal urination.  MS: Negative for  joint pain, low back pain.  Derm: Negative for rash or itching.  Neuro: Negative for weakness, abnormal sensation, seizure, frequent headaches, memory loss, confusion.  Psych: Negative for anxiety, depression, suicidal ideation, hallucinations.  Endo: Negative for unusual weight change.  Heme: Negative for bruising or bleeding. Allergy: Negative for rash or hives.    Physical Examination:  BP (!) 117/91   Pulse (!) 101   Temp (!) 96.9 F (36.1 C) (Temporal)   Ht 5' (1.524 m)   Wt 103 lb 9.6 oz (47 kg)   LMP  02/17/2019   BMI 20.23 kg/m    General: Well-nourished, well-developed in no acute distress.  Head: Normocephalic, atraumatic.   Eyes: Conjunctiva pink, no icterus. Mouth: masked Neck: Supple without thyromegaly, masses, or lymphadenopathy.  Lungs: Clear to auscultation bilaterally.  Heart: Regular rate and rhythm, no murmurs rubs or gallops.  Abdomen: Bowel sounds are normal, nondistended, no hepatosplenomegaly or masses, no abdominal bruits or    hernia , no rebound or guarding.  Moderate right upper quadrant tenderness to deep palpation Rectal: not performed Extremities: No lower extremity edema. No clubbing or deformities.  Neuro: Alert and oriented x 4 , grossly normal neurologically.  Skin: Warm and dry, no rash or jaundice.   Psych: Alert and cooperative, normal mood and affect.  Labs: See hpi  Imaging Studies: CT ABDOMEN PELVIS W CONTRAST  Result Date: 02/22/2019 CLINICAL DATA:  Acute generalized abdominal pain.  Vomiting. EXAM: CT ABDOMEN AND PELVIS WITH CONTRAST TECHNIQUE: Multidetector CT imaging of the abdomen and pelvis was performed using the standard protocol following bolus administration of intravenous contrast. CONTRAST:  OMNIPAQUE IOHEXOL 300 MG/ML  SOLN COMPARISON:  July 13, 2018. FINDINGS: Lower chest: No acute abnormality. Hepatobiliary: No focal liver abnormality is seen. No gallstones or biliary dilatation. Grossly stable 8 mm enhancing focus  is seen within the gallbladder fundus. Pancreas: Unremarkable. No pancreatic ductal dilatation or surrounding inflammatory changes. Spleen: Normal in size without focal abnormality. Adrenals/Urinary Tract: Adrenal glands are unremarkable. Kidneys are normal, without renal calculi, focal lesion, or hydronephrosis. Bladder is unremarkable. Stomach/Bowel: The stomach appears normal. There is no evidence of bowel obstruction or inflammation. Status post appendectomy. Vascular/Lymphatic: No significant vascular findings are present. No enlarged abdominal or pelvic lymph nodes. Reproductive: Uterus and bilateral adnexa are unremarkable. Other: No abdominal wall hernia or abnormality. No abdominopelvic ascites. Musculoskeletal: No acute or significant osseous findings. IMPRESSION: Stable 8 mm enhancing focus is seen involving the gallbladder fundus. Further evaluation with ultrasound is recommended. No other abnormality seen in the abdomen or pelvis. Electronically Signed   By: Lupita Raider M.D.   On: 02/22/2019 17:54   US Abdomen Limited RUQ/Gall Gladder  Result Date: 02/23/2019 CLINICAL DATA:  Upper abdominal pain for 2 days, abnormal CT exam EXAM: ULTRASOUND ABDOMEN LIMITED RIGHT UPPER QUADRANT COMPARISON:  None. FINDINGS: Gallbladder: Normally distended without stones or wall thickening. No pericholecystic fluid or sonographic Murphy sign. No definite gallbladder mass identified; specifically, no fundal lesion is detected sonographically Common bile duct: Diameter: 3 mm, normal Liver: Normal echogenicity. No mass or nodularity. Portal vein is patent on color Doppler imaging with normal direction of blood flow towards the liver. Other: No RIGHT upper quadrant free fluid. IMPRESSION: Negative RIGHT upper quadrant ultrasound. Specifically, no definite fundal nodule/thickening/stone is identified despite targeted imaging. This may represent a gallbladder polyp or focal pathology like adenomyomatosis. Since this  cannot be followed by ultrasound, recommend follow-up CT imaging in 1 year to confirm stability. Electronically Signed   By: Ulyses Southward M.D.   On: 02/23/2019 10:29

## 2019-02-28 NOTE — Assessment & Plan Note (Signed)
Pleasant 29 year old female presenting with 1 week history of acute onset right upper quadrant pain associated with nausea and vomiting.  Work-up showed table 8 mm enhancing focus in the gallbladder fundus on CT.  Similar to CT findings back in June 2020 at which time she presented with acute appendicitis.  Gallbladder fundus abnormality not seen on ultrasound.  Discussed with patient, this may not be contributing to her symptoms but because of significant acute onset right upper quadrant pain with vomiting, we will complete gallbladder work-up with HIDA scan.  If she does not end up getting her gallbladder out, she will need to have CT imaging in 1 year to confirm stability of gallbladder focus at minimum.  I am concerned that her symptoms may be secondary to peptic ulcer disease in the setting of NSAID/aspirin use.  We will switch her famotidine to Dexilant 60 mg daily before breakfast.  I provided her with samples for 20 days.  Once she completes though she can go back to famotidine twice daily.  Request that she avoid NSAID and aspirin use.  If HIDA is unremarkable, she will need to have upper endoscopy as next step.  Further recommendations to follow.

## 2019-02-28 NOTE — Progress Notes (Signed)
No pcp per patient 

## 2019-02-28 NOTE — Patient Instructions (Signed)
1. Hold famotidine. Start Dexilant 60mg  once daily 30 minutes before a meal for possible ulcers. Once you run out of samples, restart the famotidine twice daily. 2. Try Linzess once daily AS NEEDED for constipation. Samples provided.  3. HIDA as scheduled. If negative, next step will be the upper endoscopy to rule out ulcers.

## 2019-03-04 ENCOUNTER — Ambulatory Visit (HOSPITAL_COMMUNITY): Payer: Self-pay

## 2019-03-05 ENCOUNTER — Encounter (HOSPITAL_COMMUNITY): Payer: Self-pay

## 2019-03-05 ENCOUNTER — Other Ambulatory Visit: Payer: Self-pay

## 2019-03-05 ENCOUNTER — Emergency Department (HOSPITAL_COMMUNITY)
Admission: EM | Admit: 2019-03-05 | Discharge: 2019-03-06 | Disposition: A | Discharge status: Home / Self Care | Payer: Self-pay | Attending: Emergency Medicine | Admitting: Emergency Medicine

## 2019-03-05 DIAGNOSIS — F17210 Nicotine dependence, cigarettes, uncomplicated: Secondary | ICD-10-CM | POA: Insufficient documentation

## 2019-03-05 DIAGNOSIS — R1011 Right upper quadrant pain: Secondary | ICD-10-CM | POA: Insufficient documentation

## 2019-03-05 LAB — I-STAT BETA HCG BLOOD, ED (MC, WL, AP ONLY): I-stat hCG, quantitative: <5 m[IU]/mL (ref <5)

## 2019-03-05 MED ORDER — DIPHENHYDRAMINE HCL 50 MG/ML IJ SOLN
50.0000 mg | Freq: Once | INTRAMUSCULAR | Status: AC
  Administered 2019-03-05: 50 mg via INTRAVENOUS
  Filled 2019-03-05: qty 1

## 2019-03-05 MED ORDER — METOCLOPRAMIDE HCL 5 MG/ML IJ SOLN
10.0000 mg | Freq: Once | INTRAMUSCULAR | Status: AC
  Administered 2019-03-05: 10 mg via INTRAVENOUS
  Filled 2019-03-05: qty 2

## 2019-03-05 MED ORDER — SODIUM CHLORIDE 0.9 % IV BOLUS (SEPSIS): 1000.0000 mL | Freq: Once | INTRAVENOUS | Status: DC

## 2019-03-05 NOTE — ED Triage Notes (Signed)
Pt has ongoing abdominal pain, nausea, and vomiting. Pt is out of Vicodin that were prescribed on 02/25/19. Pt says she was supposed to f/u yesterday at [facility], but was unable to do so because of the weather.

## 2019-03-05 NOTE — ED Provider Notes (Signed)
[facility] EMERGENCY DEPARTMENT Provider Note   CSN: [id number] Arrival date & time: 03/05/19  2215     History Chief Complaint  Patient presents with  . Abdominal Pain    Kelli Hoover is a 29 y.o. female.  The history is provided by the patient.  Abdominal Pain Pain location:  RUQ and epigastric Pain quality: sharp   Pain radiates to:  Back Pain severity:  Severe Onset quality:  Gradual Duration:  3 days Timing:  Intermittent Progression:  Worsening Chronicity:  Recurrent Relieved by:  NSAIDs Worsened by:  Movement and palpation Associated symptoms: vomiting   Associated symptoms: no chest pain, no diarrhea, no dysuria, no fever, no hematemesis, no hematochezia, no vaginal bleeding and no vaginal discharge   Risk factors: NSAID use   Patient presents for right upper quadrant epigastric abdominal pain.  This is been ongoing for the past 3 days, but is similar to prior episodes.  She has had a recent extensive evaluation including gastroenterology consultation as well as CT scan of abdomen pelvis as well as right upper quadrant ultrasound. She reports this pain is similar to prior.  She has also been using NSAIDs again as she has run out of pain medicines No fevers, but she reports multiple episodes of vomiting.  No gross blood in vomit or stool.  She does report recent dark stool but that has been going on for quite some time. Supposed to have an outpatient HIDA scan but was unable to make it due to the weather     Past Medical History:  Diagnosis Date  . Migraine   . Miscarriage   . Panic attack     Patient Active Problem List   Diagnosis Date Noted  . Right upper quadrant pain 02/28/2019  . Gallbladder mass 02/28/2019  . Constipation 02/28/2019  . Nausea with vomiting 02/28/2019  . Acute appendicitis   . GAD (generalized anxiety disorder) 07/20/2014    Past Surgical History:  Procedure Laterality Date  . DILATION AND CURETTAGE OF UTERUS    . INDUCED  ABORTION    . LAPAROSCOPIC APPENDECTOMY N/A 07/13/2018   Procedure: LAPAROSCOPIC APPENDECTOMY;  Surgeon: Aviva Signs, MD;  Location: [facility];  Service: General;  Laterality: N/A;     OB History    Gravida  2   Para      Term      Preterm      AB      Living        SAB      TAB      Ectopic      Multiple      Live Births              Family History  Problem Relation Age of Onset  . Hypertension Mother   . Anxiety disorder Mother   . Cancer Other   . Hypertension Other   . Anxiety disorder Sister   . Anxiety disorder Maternal Grandmother   . Colon cancer Maternal Grandmother        age greater than 55  . Ulcers Paternal Aunt     Social History   Tobacco Use  . Smoking status: Current Every Day Smoker    Packs/day: 1.00    Years: 7.00    Pack years: 7.00    Types: Cigarettes  . Smokeless tobacco: Never Used  Substance Use Topics  . Alcohol use: Not Currently  . Drug use: No    Home Medications Prior to Admission  medications   Medication Sig Start Date End Date Taking? Authorizing Provider  dexlansoprazole (DEXILANT) 60 MG capsule Take 1 capsule (60 mg total) by mouth daily before breakfast. 02/28/19   Tiffany Kocher, PA-C  famotidine (PEPCID) 20 MG tablet Take 1 tablet (20 mg total) by mouth 2 (two) times daily. HOLD WHILE ON DEXILANT 02/28/19   Tiffany Kocher, PA-C  linaclotide (LINZESS) 290 MCG CAPS capsule Take 1 capsule (290 mcg total) by mouth daily as needed. 02/28/19   Tiffany Kocher, PA-C    Allergies    Erythromycin  Review of Systems   Review of Systems  Constitutional: Negative for fever.  Cardiovascular: Negative for chest pain.  Gastrointestinal: Positive for abdominal pain and vomiting. Negative for blood in stool, diarrhea, hematemesis and hematochezia.  Genitourinary: Negative for dysuria, vaginal bleeding and vaginal discharge.  All other systems reviewed and are negative.   Physical Exam Updated Vital Signs Temp 98.4 F  (36.9 C) (Oral)   Ht 1.6 m (5\' 3" )   Wt 47 kg   LMP 02/17/2019   BMI 18.35 kg/m   Physical Exam  CONSTITUTIONAL: Well developed/well nourished HEAD: Normocephalic/atraumatic EYES: EOMI/PERRL, no icterus, conjunctiva pink ENMT: Mucous membranes moist NECK: supple no meningeal signs SPINE/BACK:entire spine nontender CV: S1/S2 noted, no murmurs/rubs/gallops noted LUNGS: Lungs are clear to auscultation bilaterally, no apparent distress ABDOMEN: soft, mild RUQ tenderness, no rebound or guarding, bowel sounds noted throughout abdomen, no distention GU:no cva tenderness NEURO: Pt is awake/alert/appropriate, moves all extremitiesx4.  No facial droop.   EXTREMITIES: pulses normal/equal, full ROM SKIN: warm, color normal PSYCH: no abnormalities of mood noted, alert and oriented to situation     ED Results / Procedures / Treatments   Labs (all labs ordered are listed, but only abnormal results are displayed) Labs Reviewed  CBC WITH DIFFERENTIAL/PLATELET - Abnormal; Notable for the following components:      Result Value   WBC 12.3 (*)    Neutro Abs 9.3 (*)    All other components within normal limits  COMPREHENSIVE METABOLIC PANEL - Abnormal; Notable for the following components:   Glucose, Bld 105 (*)    Calcium 8.8 (*)    All other components within normal limits  LIPASE, BLOOD  I-STAT BETA HCG BLOOD, ED ([facility], [facility], [facility] ONLY)    EKG ED ECG REPORT   Date: 03/05/2019 2356  Rate: 73  Rhythm: normal sinus rhythm  QRS Axis: normal  Intervals: normal  ST/T Wave abnormalities: normal  Conduction Disutrbances:none  Narrative Interpretation:   Old EKG Reviewed: unchanged  I have personally reviewed the EKG tracing and agree with the computerized printout as noted.  Radiology No results found.  Procedures Procedures   Medications Ordered in ED Medications  sodium chloride 0.9 % bolus 1,000 mL (1,000 mLs Intravenous Refused 03/05/19 2359)  metoCLOPramide (REGLAN) injection  10 mg (10 mg Intravenous Given 03/05/19 2348)  diphenhydrAMINE (BENADRYL) injection 50 mg (50 mg Intravenous Given 03/05/19 2348)  haloperidol lactate (HALDOL) injection 2.5 mg (2.5 mg Intravenous Given 03/06/19 0017)    ED Course  I have reviewed the triage vital signs and the nursing notes.  Pertinent labs & imaging results that were available during my care of the patient were reviewed by me and considered in my medical decision making (see chart for details).    MDM Rules/Calculators/A&P                      11:34 PM Patient with recent  work-up by gastroenterology, she is also had CT imaging and ultrasound imaging. Next steps are HIDA scan, as well as use of PPI as the differential includes peptic ulcer disease versus biliary colic.  If HIDA negative she will undergo EGD Patient notes this is similar previous episodes.  Will defer imaging at this time.  Will perform screening labs due to recent persistent vomiting. We will try non-narcotic regimen for her pain 12:59 AM Overall labs reassuring Pt resting comfortably, falls asleep during reassessment She reports continued pain but appears more comfortable No signs of acute ABD emergency Will d/c home Advised to f/u with GI and continue outpatient management and advised she will not be receiving narcotics  Final Clinical Impression(s) / ED Diagnoses Final diagnoses:  Right upper quadrant abdominal pain    Rx / DC Orders ED Discharge Orders    None       Zadie Rhine, MD 03/06/19 0100

## 2019-03-06 LAB — CBC WITH DIFFERENTIAL/PLATELET
Abs Immature Granulocytes: 0.05 10*3/uL (ref 0.00–0.07)
Basophils Absolute: 0 10*3/uL (ref 0.0–0.1)
Basophils Relative: 0 %
Eosinophils Absolute: 0.3 10*3/uL (ref 0.0–0.5)
Eosinophils Relative: 2 %
HCT: 44.7 % (ref 36.0–46.0)
Hemoglobin: 14.9 g/dL (ref 12.0–15.0)
Immature Granulocytes: 0 %
Lymphocytes Relative: 18 %
Lymphs Abs: 2.2 10*3/uL (ref 0.7–4.0)
MCH: 30.6 pg (ref 26.0–34.0)
MCHC: 33.3 g/dL (ref 30.0–36.0)
MCV: 91.8 fL (ref 80.0–100.0)
Monocytes Absolute: 0.5 10*3/uL (ref 0.1–1.0)
Monocytes Relative: 4 %
Neutro Abs: 9.3 10*3/uL — ABNORMAL HIGH (ref 1.7–7.7)
Neutrophils Relative %: 76 %
Platelets: 324 10*3/uL (ref 150–400)
RBC: 4.87 MIL/uL (ref 3.87–5.11)
RDW: 14.1 % (ref 11.5–15.5)
WBC: 12.3 10*3/uL — ABNORMAL HIGH (ref 4.0–10.5)
nRBC: 0 % (ref 0.0–0.2)

## 2019-03-06 LAB — COMPREHENSIVE METABOLIC PANEL
ALT: 18 U/L (ref 0–44)
AST: 17 U/L (ref 15–41)
Albumin: 3.9 g/dL (ref 3.5–5.0)
Alkaline Phosphatase: 96 U/L (ref 38–126)
Anion gap: 9 (ref 5–15)
BUN: 9 mg/dL (ref 6–20)
CO2: 26 mmol/L (ref 22–32)
Calcium: 8.8 mg/dL — ABNORMAL LOW (ref 8.9–10.3)
Chloride: 103 mmol/L (ref 98–111)
Creatinine, Ser: 0.57 mg/dL (ref 0.44–1.00)
GFR calc Af Amer: >60 mL/min (ref >60)
GFR calc non Af Amer: >60 mL/min (ref >60)
Glucose, Bld: 105 mg/dL — ABNORMAL HIGH (ref 70–99)
Potassium: 3.7 mmol/L (ref 3.5–5.1)
Sodium: 138 mmol/L (ref 135–145)
Total Bilirubin: 0.4 mg/dL (ref 0.3–1.2)
Total Protein: 7.6 g/dL (ref 6.5–8.1)

## 2019-03-06 LAB — LIPASE, BLOOD: Lipase: 18 U/L (ref 11–51)

## 2019-03-06 MED ORDER — HALOPERIDOL LACTATE 5 MG/ML IJ SOLN
2.5000 mg | Freq: Once | INTRAMUSCULAR | Status: AC
  Administered 2019-03-06: 2.5 mg via INTRAVENOUS
  Filled 2019-03-06: qty 1

## 2019-03-06 NOTE — Discharge Instructions (Addendum)
  SEEK IMMEDIATE MEDICAL ATTENTION IF: The pain does not go away or becomes severe, particularly over the next 8-12 hours.  A temperature above 100.4F develops.  Repeated vomiting occurs (multiple episodes).  The pain becomes localized to portions of the abdomen.  Blood is being passed in stools or vomit (bright red or black tarry stools).  Return also if you develop chest pain, difficulty breathing, dizziness or fainting, or become confused, poorly responsive, or inconsolable.  

## 2019-03-06 NOTE — ED Notes (Signed)
Pt sleeping when went to administer haldol.

## 2019-03-07 ENCOUNTER — Emergency Department (HOSPITAL_COMMUNITY): Payer: Self-pay

## 2019-03-07 ENCOUNTER — Emergency Department (HOSPITAL_COMMUNITY)
Admission: EM | Admit: 2019-03-07 | Discharge: 2019-03-07 | Disposition: A | Discharge status: Home / Self Care | Payer: Self-pay | Attending: Emergency Medicine | Admitting: Emergency Medicine

## 2019-03-07 ENCOUNTER — Encounter (HOSPITAL_COMMUNITY): Payer: Self-pay | Admitting: Emergency Medicine

## 2019-03-07 ENCOUNTER — Other Ambulatory Visit: Payer: Self-pay

## 2019-03-07 DIAGNOSIS — Y939 Activity, unspecified: Secondary | ICD-10-CM | POA: Insufficient documentation

## 2019-03-07 DIAGNOSIS — F17210 Nicotine dependence, cigarettes, uncomplicated: Secondary | ICD-10-CM | POA: Insufficient documentation

## 2019-03-07 DIAGNOSIS — IMO0001 Reserved for inherently not codable concepts without codable children: Secondary | ICD-10-CM

## 2019-03-07 DIAGNOSIS — X58XXXA Exposure to other specified factors, initial encounter: Secondary | ICD-10-CM | POA: Insufficient documentation

## 2019-03-07 DIAGNOSIS — S0300XA Dislocation of jaw, unspecified side, initial encounter: Secondary | ICD-10-CM | POA: Insufficient documentation

## 2019-03-07 DIAGNOSIS — Y929 Unspecified place or not applicable: Secondary | ICD-10-CM | POA: Insufficient documentation

## 2019-03-07 DIAGNOSIS — Y999 Unspecified external cause status: Secondary | ICD-10-CM | POA: Insufficient documentation

## 2019-03-07 DIAGNOSIS — S02641A Fracture of ramus of right mandible, initial encounter for closed fracture: Secondary | ICD-10-CM | POA: Insufficient documentation

## 2019-03-07 MED ORDER — OXYCODONE-ACETAMINOPHEN 5-325 MG PO TABS: 1.0000 | ORAL_TABLET | ORAL | 0 refills | Status: DC | PRN

## 2019-03-07 MED ORDER — HYDROMORPHONE HCL 1 MG/ML IJ SOLN
0.5000 mg | Freq: Once | INTRAMUSCULAR | Status: AC
  Administered 2019-03-07: 11:00:00 0.5 mg via INTRAMUSCULAR
  Filled 2019-03-07: qty 1

## 2019-03-07 NOTE — ED Triage Notes (Signed)
Pt reports left jaw pain and feeling like it is locking and pulling to the left since last night.

## 2019-03-07 NOTE — Discharge Instructions (Addendum)
I spoke to the specialist at [facility] Dr. Rubye Oaks.  Their office will be calling you for an appointment on Wednesday in the facial trauma clinic. Ice pack.  Pain medicine.  It is extremely important that you take the CD of your x-ray with you
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
## 2019-03-17 ENCOUNTER — Encounter (HOSPITAL_COMMUNITY): Payer: Self-pay | Admitting: Emergency Medicine

## 2019-03-17 ENCOUNTER — Telehealth: Payer: Self-pay | Admitting: Internal Medicine

## 2019-03-17 ENCOUNTER — Encounter (HOSPITAL_COMMUNITY)
Admission: RE | Admit: 2019-03-17 | Discharge: 2019-03-17 | Disposition: A | Discharge status: Home / Self Care | Payer: Self-pay | Source: Ambulatory Visit | Attending: Gastroenterology | Admitting: Gastroenterology

## 2019-03-17 ENCOUNTER — Encounter (HOSPITAL_COMMUNITY): Payer: Self-pay

## 2019-03-17 ENCOUNTER — Other Ambulatory Visit: Payer: Self-pay

## 2019-03-17 ENCOUNTER — Emergency Department (HOSPITAL_COMMUNITY)
Admission: EM | Admit: 2019-03-17 | Discharge: 2019-03-17 | Discharge status: Against Medical Advice | Payer: Self-pay | Attending: Emergency Medicine | Admitting: Emergency Medicine

## 2019-03-17 DIAGNOSIS — R112 Nausea with vomiting, unspecified: Secondary | ICD-10-CM | POA: Insufficient documentation

## 2019-03-17 DIAGNOSIS — R400 Somnolence: Secondary | ICD-10-CM | POA: Insufficient documentation

## 2019-03-17 DIAGNOSIS — Z5320 Procedure and treatment not carried out because of patient's decision for unspecified reasons: Secondary | ICD-10-CM | POA: Insufficient documentation

## 2019-03-17 DIAGNOSIS — R1011 Right upper quadrant pain: Secondary | ICD-10-CM | POA: Insufficient documentation

## 2019-03-17 DIAGNOSIS — F1910 Other psychoactive substance abuse, uncomplicated: Secondary | ICD-10-CM | POA: Insufficient documentation

## 2019-03-17 LAB — RAPID URINE DRUG SCREEN, HOSP PERFORMED
Amphetamines: NOT DETECTED
Barbiturates: NOT DETECTED
Benzodiazepines: POSITIVE — AB
Cocaine: NOT DETECTED
Opiates: POSITIVE — AB
Tetrahydrocannabinol: NOT DETECTED

## 2019-03-17 LAB — COMPREHENSIVE METABOLIC PANEL
ALT: 31 U/L (ref 0–44)
AST: 19 U/L (ref 15–41)
Albumin: 3.4 g/dL — ABNORMAL LOW (ref 3.5–5.0)
Alkaline Phosphatase: 96 U/L (ref 38–126)
Anion gap: 11 (ref 5–15)
BUN: 8 mg/dL (ref 6–20)
CO2: 24 mmol/L (ref 22–32)
Calcium: 8.9 mg/dL (ref 8.9–10.3)
Chloride: 102 mmol/L (ref 98–111)
Creatinine, Ser: 0.61 mg/dL (ref 0.44–1.00)
GFR calc Af Amer: >60 mL/min (ref >60)
GFR calc non Af Amer: >60 mL/min (ref >60)
Glucose, Bld: 93 mg/dL (ref 70–99)
Potassium: 3.7 mmol/L (ref 3.5–5.1)
Sodium: 137 mmol/L (ref 135–145)
Total Bilirubin: 0.5 mg/dL (ref 0.3–1.2)
Total Protein: 6.3 g/dL — ABNORMAL LOW (ref 6.5–8.1)

## 2019-03-17 LAB — CBC WITH DIFFERENTIAL/PLATELET
Abs Immature Granulocytes: 0.03 10*3/uL (ref 0.00–0.07)
Basophils Absolute: 0 10*3/uL (ref 0.0–0.1)
Basophils Relative: 0 %
Eosinophils Absolute: 0.3 10*3/uL (ref 0.0–0.5)
Eosinophils Relative: 3 %
HCT: 43.1 % (ref 36.0–46.0)
Hemoglobin: 13.9 g/dL (ref 12.0–15.0)
Immature Granulocytes: 0 %
Lymphocytes Relative: 48 %
Lymphs Abs: 3.6 10*3/uL (ref 0.7–4.0)
MCH: 30 pg (ref 26.0–34.0)
MCHC: 32.3 g/dL (ref 30.0–36.0)
MCV: 93.1 fL (ref 80.0–100.0)
Monocytes Absolute: 0.5 10*3/uL (ref 0.1–1.0)
Monocytes Relative: 6 %
Neutro Abs: 3.3 10*3/uL (ref 1.7–7.7)
Neutrophils Relative %: 43 %
Platelets: 230 10*3/uL (ref 150–400)
RBC: 4.63 MIL/uL (ref 3.87–5.11)
RDW: 14.1 % (ref 11.5–15.5)
WBC: 7.7 10*3/uL (ref 4.0–10.5)
nRBC: 0 % (ref 0.0–0.2)

## 2019-03-17 LAB — ACETAMINOPHEN LEVEL: Acetaminophen (Tylenol), Serum: <10 ug/mL — ABNORMAL LOW (ref 10–30)

## 2019-03-17 LAB — SALICYLATE LEVEL: Salicylate Lvl: <7 mg/dL — ABNORMAL LOW (ref 7.0–30.0)

## 2019-03-17 LAB — ETHANOL: Alcohol, Ethyl (B): <10 mg/dL (ref <10)

## 2019-03-17 LAB — AMMONIA: Ammonia: 48 umol/L — ABNORMAL HIGH (ref 9–35)

## 2019-03-17 MED ORDER — NALOXONE HCL 0.4 MG/ML IJ SOLN
0.4000 mg | Freq: Once | INTRAMUSCULAR | Status: AC
  Administered 2019-03-17: 15:00:00 0.4 mg via INTRAVENOUS
  Filled 2019-03-17: qty 1

## 2019-03-17 MED ORDER — SODIUM CHLORIDE 0.9 % IV BOLUS: 1000.0000 mL | Freq: Once | INTRAVENOUS | Status: DC
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
## 2019-03-17 NOTE — ED Notes (Signed)
Pt states that she didn't pass out and wasn't on the bathroom floor. Pt tearful.

## 2019-03-17 NOTE — Telephone Encounter (Signed)
Given report by nuc med, it was advised patient be evaluated in ED.

## 2019-03-17 NOTE — ED Provider Notes (Signed)
Patient is a ill-appearing 29 year old female who presents from the nuclear medicine suite where she was found unresponsive on the floor in the bathroom having difficulty breathing.  The patient responds to a sternal rub and painful stimuli where she very eloquently curses at me.  Then she falls back asleep and has a slow respiratory rate.  With Narcan she wakes up almost immediately continues to curse and refuses to stay, when awake she has medical decision-making capacity and wants to leave AGAINST MEDICAL ADVICE understanding the consequences of leaving without further stabilizing care.  She denies using any opiates however her immediate response to Narcan is suggestive of her not being honest with her substance abuse.  At this time the patient does have Medical Center making capacity, airway is intact after Narcan, she has the right to leave at this point.  Medical screening examination/treatment/procedure(s) were conducted as a shared visit with non-physician practitioner(s) and myself.  I personally evaluated the patient during the encounter.  Clinical Impression:   Final diagnoses:  Somnolence         Eber Hong, MD 03/19/19 1630

## 2019-03-17 NOTE — ED Triage Notes (Signed)
Pt here from [facility]uc med found in the bathroom floor , pt now awake cursing denying drug use , hida scan was cancelled

## 2019-03-17 NOTE — Telephone Encounter (Signed)
[facility] called to let us know patient was scheduled a HIDA today and was late, but they worked her in. Patient left before HIDA was done and came back at 1230 and disappeared again. It was said that she was found laying on the bathroom floor and she was acting "loopy" and "heavily medicated". The patient is denying drug use and said she took a tramadol last night at midnight. [facility] Med is uncomfortable in proceeding with HIDA. Per LSL it was advised for the patient to be evaluated in the ER. JL spoke with [facility] to advise what LSL had said.

## 2019-03-17 NOTE — ED Provider Notes (Addendum)
[place] EMERGENCY DEPARTMENT Provider Note   CSN: [id number] Arrival date & time: 03/17/19  1400     History No chief complaint on file.   Kelli Hoover is a 29 y.o. female presenting for altered mental status.  Level 5 caveat due to altered mental status.  Per chart review, patient was at nuclear med getting a HIDA scan.  She was found in the bathroom "loopy" and appearing as if she is "heavily medicated."  Patient unable to write history as she cannot stay awake long enough to answer any questions.  Additional history obtained from chart review.  Patient without documented history of drug use.  She has been seen in the ER multiple times for abdominal symptoms and epigastric pain.  In 2015, patient had HSV encephalitis. Recently had a fracture of the mandible, given 15 Percocet on 2/15.    HPI     Past Medical History:  Diagnosis Date  . Migraine   . Miscarriage   . Panic attack     Patient Active Problem List   Diagnosis Date Noted  . Right upper quadrant pain 02/28/2019  . Gallbladder mass 02/28/2019  . Constipation 02/28/2019  . Nausea with vomiting 02/28/2019  . Acute appendicitis   . GAD (generalized anxiety disorder) 07/20/2014    Past Surgical History:  Procedure Laterality Date  . DILATION AND CURETTAGE OF UTERUS    . INDUCED ABORTION    . LAPAROSCOPIC APPENDECTOMY N/A 07/13/2018   Procedure: LAPAROSCOPIC APPENDECTOMY;  Surgeon: Aviva Signs, MD;  Location: [facility];  Service: General;  Laterality: N/A;     OB History    Gravida  2   Para      Term      Preterm      AB      Living        SAB      TAB      Ectopic      Multiple      Live Births              Family History  Problem Relation Age of Onset  . Hypertension Mother   . Anxiety disorder Mother   . Cancer Other   . Hypertension Other   . Anxiety disorder Sister   . Anxiety disorder Maternal Grandmother   . Colon cancer Maternal Grandmother          age greater than 62  . Ulcers Paternal Aunt     Social History   Tobacco Use  . Smoking status: Current Every Day Smoker    Packs/day: 1.00    Years: 7.00    Pack years: 7.00    Types: Cigarettes  . Smokeless tobacco: Never Used  Substance Use Topics  . Alcohol use: Not Currently  . Drug use: No    Home Medications Prior to Admission medications   Medication Sig Start Date End Date Taking? Authorizing Provider  dexlansoprazole (DEXILANT) 60 MG capsule Take 1 capsule (60 mg total) by mouth daily before breakfast. 02/28/19   Mahala Menghini, PA-C  linaclotide (LINZESS) 290 MCG CAPS capsule Take 1 capsule (290 mcg total) by mouth daily as needed. 02/28/19   Mahala Menghini, PA-C  oxyCODONE-acetaminophen (PERCOCET) 5-325 MG tablet Take 1 tablet by mouth every 4 (four) hours as needed. 03/07/19   Nat Christen, MD    Allergies    Erythromycin  Review of Systems   Review of Systems  Unable to perform ROS: Mental status  change    Physical Exam Updated Vital Signs BP 101/87   Pulse (!) 103   Temp 98.6 F (37 C) (Oral)   Resp (!) 8   LMP 02/17/2019   SpO2 100%   Physical Exam Vitals and nursing note reviewed.  Constitutional:      Appearance: She is well-developed.     Comments: altered.  Very sleepy.  Will curse when sternal rub, and immediately fell back asleep  HENT:     Head: Normocephalic and atraumatic.  Eyes:     Comments: Pinpoint pupils  Cardiovascular:     Rate and Rhythm: Normal rate and regular rhythm.     Pulses: Normal pulses.  Pulmonary:     Effort: Pulmonary effort is normal. No respiratory distress.     Breath sounds: Normal breath sounds. No wheezing.  Abdominal:     General: There is no distension.     Palpations: Abdomen is soft. There is no mass.     Tenderness: There is abdominal tenderness. There is no guarding or rebound.     Comments: ttp of epigastric and RUQ abd  Musculoskeletal:        General: Normal range of motion.     Cervical  back: Normal range of motion and neck supple.  Skin:    General: Skin is warm and dry.     Capillary Refill: Capillary refill takes less than 2 seconds.  Neurological:     Comments: Altered.  Extremely sleepy/sedated.  Will minimally arouse to sternal rub and immediately fall back asleep.     ED Results / Procedures / Treatments   Labs (all labs ordered are listed, but only abnormal results are displayed) Labs Reviewed  CBC WITH DIFFERENTIAL/PLATELET  COMPREHENSIVE METABOLIC PANEL  RAPID URINE DRUG SCREEN, HOSP PERFORMED  ETHANOL  SALICYLATE LEVEL  ACETAMINOPHEN LEVEL  AMMONIA    EKG None  Radiology No results found.  Procedures .Critical Care Performed by: Alveria Apley, PA-C Authorized by: Alveria Apley, PA-C   Critical care provider statement:    Critical care time (minutes):  45   Critical care time was exclusive of:  Separately billable procedures and treating other patients and teaching time   Critical care was necessary to treat or prevent imminent or life-threatening deterioration of the following conditions:  CNS failure or compromise   Critical care was time spent personally by me on the following activities:  Blood draw for specimens, development of treatment plan with patient or surrogate, evaluation of patient's response to treatment, examination of patient, obtaining history from patient or surrogate, ordering and performing treatments and interventions, ordering and review of laboratory studies, ordering and review of radiographic studies, pulse oximetry, re-evaluation of patient's condition and review of old charts   I assumed direction of critical care for this patient from another provider in my specialty: no   Comments:     Pt presenting altered. required multiple rechecks of mental status, and administration of narcan to improve mental status.    (including critical care time)  Medications Ordered in ED Medications  naloxone (NARCAN) injection  0.4 mg (0.4 mg Intravenous Given 03/17/19 1503)    ED Course  I have reviewed the triage vital signs and the nursing notes.  Pertinent labs & imaging results that were available during my care of the patient were reviewed by me and considered in my medical decision making (see chart for details).    MDM Rules/Calculators/A&P  Resenting for evaluation of altered mental status.  Physical exam shows patient who is extremely sleepy/sedated.  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  No obvious track marks.  No history of substance abuse documented.  Will obtain labs and urine.  Will give Narcan due to low RR, undifferentiated AMS, and extreme sleepiness.  Discussed with attending, Dr. Hyacinth Meeker evaluated the patient.  Patient will go immediately after administration of Narcan.  She is no longer sedated or sleepy.  She is opening her eyes spontaneously and moving all extremities.  She is requesting to leave.  I discussed my concerns that there may be an underlying medical problem.  Patient states she does not care and would like to leave.  Will have patient sign out AGAINST MEDICAL ADVICE.  She is now alert and oriented and able to understand risks of leaving.  Informed by security that patient drove herself here.  I discussed with patient, who states she has somebody coming to pick her up.      Pt once again asleep in the room, but easier to arouse. Will continue to monitor, as we are pending someone to pick up pt.   BP soft, but improved when pt is not on her side. Will monitor  Pt sitting up in bed asking for something to eat  Pt able to ambulate to bathroom without difficulty.   Labs interpreted by me.  Overall reassuring.  Patient has mildly elevated ammonia 48, however LFTs are normal.  Doubt hepatic encephalopathy.  UDS positive for opioids and benzos, likely the cause for her altered mental status, especially in the setting of  improvement with Narcan.   Final Clinical Impression(s) / ED Diagnoses Final diagnoses:  None    Rx / DC Orders ED Discharge Orders    None       Alveria Apley, PA-C 03/17/19 1520    [place], [place], PA-C 03/17/19 1808    Eber Hong, MD 03/19/19 1630
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
## 2019-03-17 NOTE — Telephone Encounter (Signed)
Noted. Routing to LSL for FYI.  

## 2019-03-17 NOTE — Discharge Instructions (Addendum)
You have signed out AGAINST MEDICAL ADVICE.  You may have a life-threatening condition, but have chosen to leave prior to completion of evaluation. Return to the emergency room with any new, worsening, concerning symptoms.

## 2019-03-17 NOTE — ED Notes (Signed)
Per Dr Hyacinth Meeker, pt not oriented enough to refuse ordered Narcan. Pt rousable to pain and then falls back asleep.

## 2020-07-19 ENCOUNTER — Encounter (HOSPITAL_BASED_OUTPATIENT_CLINIC_OR_DEPARTMENT_OTHER): Payer: Self-pay | Admitting: *Deleted

## 2020-07-19 ENCOUNTER — Emergency Department (HOSPITAL_BASED_OUTPATIENT_CLINIC_OR_DEPARTMENT_OTHER): Payer: Self-pay

## 2020-07-19 ENCOUNTER — Other Ambulatory Visit: Payer: Self-pay

## 2020-07-19 ENCOUNTER — Emergency Department (HOSPITAL_BASED_OUTPATIENT_CLINIC_OR_DEPARTMENT_OTHER)
Admission: EM | Admit: 2020-07-19 | Discharge: 2020-07-19 | Disposition: A | Discharge status: Home / Self Care | Payer: Self-pay | Attending: Emergency Medicine | Admitting: Emergency Medicine

## 2020-07-19 DIAGNOSIS — R1011 Right upper quadrant pain: Secondary | ICD-10-CM

## 2020-07-19 DIAGNOSIS — F17210 Nicotine dependence, cigarettes, uncomplicated: Secondary | ICD-10-CM | POA: Insufficient documentation

## 2020-07-19 DIAGNOSIS — N3001 Acute cystitis with hematuria: Secondary | ICD-10-CM | POA: Insufficient documentation

## 2020-07-19 LAB — WET PREP, GENITAL
Clue Cells Wet Prep HPF POC: NONE SEEN
Sperm: NONE SEEN
Trich, Wet Prep: NONE SEEN
Yeast Wet Prep HPF POC: NONE SEEN

## 2020-07-19 LAB — CBC WITH DIFFERENTIAL/PLATELET
Abs Immature Granulocytes: 0.09 10*3/uL — ABNORMAL HIGH (ref 0.00–0.07)
Basophils Absolute: 0 10*3/uL (ref 0.0–0.1)
Basophils Relative: 0 %
Eosinophils Absolute: 0 10*3/uL (ref 0.0–0.5)
Eosinophils Relative: 0 %
HCT: 44.5 % (ref 36.0–46.0)
Hemoglobin: 15.4 g/dL — ABNORMAL HIGH (ref 12.0–15.0)
Immature Granulocytes: 1 %
Lymphocytes Relative: 15 %
Lymphs Abs: 2.4 10*3/uL (ref 0.7–4.0)
MCH: 31.3 pg (ref 26.0–34.0)
MCHC: 34.6 g/dL (ref 30.0–36.0)
MCV: 90.4 fL (ref 80.0–100.0)
Monocytes Absolute: 0.4 10*3/uL (ref 0.1–1.0)
Monocytes Relative: 3 %
Neutro Abs: 12.8 10*3/uL — ABNORMAL HIGH (ref 1.7–7.7)
Neutrophils Relative %: 81 %
Platelets: 247 10*3/uL (ref 150–400)
RBC: 4.92 MIL/uL (ref 3.87–5.11)
RDW: 13.7 % (ref 11.5–15.5)
WBC: 15.8 10*3/uL — ABNORMAL HIGH (ref 4.0–10.5)
nRBC: 0 % (ref 0.0–0.2)

## 2020-07-19 LAB — URINALYSIS, ROUTINE W REFLEX MICROSCOPIC
Bilirubin Urine: NEGATIVE
Glucose, UA: NEGATIVE mg/dL
Ketones, ur: NEGATIVE mg/dL
Leukocytes,Ua: NEGATIVE
Nitrite: POSITIVE — AB
Protein, ur: NEGATIVE mg/dL
Specific Gravity, Urine: >1.03 — ABNORMAL HIGH (ref 1.005–1.030)
pH: 6 (ref 5.0–8.0)

## 2020-07-19 LAB — LIPASE, BLOOD: Lipase: 30 U/L (ref 11–51)

## 2020-07-19 LAB — COMPREHENSIVE METABOLIC PANEL
ALT: 10 U/L (ref 0–44)
AST: 13 U/L — ABNORMAL LOW (ref 15–41)
Albumin: 4 g/dL (ref 3.5–5.0)
Alkaline Phosphatase: 58 U/L (ref 38–126)
Anion gap: 7 (ref 5–15)
BUN: 11 mg/dL (ref 6–20)
CO2: 21 mmol/L — ABNORMAL LOW (ref 22–32)
Calcium: 8.2 mg/dL — ABNORMAL LOW (ref 8.9–10.3)
Chloride: 110 mmol/L (ref 98–111)
Creatinine, Ser: 0.69 mg/dL (ref 0.44–1.00)
GFR, Estimated: >60 mL/min (ref >60)
Glucose, Bld: 111 mg/dL — ABNORMAL HIGH (ref 70–99)
Potassium: 4 mmol/L (ref 3.5–5.1)
Sodium: 138 mmol/L (ref 135–145)
Total Bilirubin: 0.4 mg/dL (ref 0.3–1.2)
Total Protein: 7.2 g/dL (ref 6.5–8.1)

## 2020-07-19 LAB — URINALYSIS, MICROSCOPIC (REFLEX)

## 2020-07-19 LAB — PREGNANCY, URINE: Preg Test, Ur: NEGATIVE

## 2020-07-19 MED ORDER — CEPHALEXIN 500 MG PO CAPS: 500.0000 mg | ORAL_CAPSULE | Freq: Four times a day (QID) | ORAL | 0 refills | Status: AC

## 2020-07-19 MED ORDER — SODIUM CHLORIDE 0.9 % IV BOLUS
1000.0000 mL | Freq: Once | INTRAVENOUS | Status: AC
  Administered 2020-07-19: 1000 mL via INTRAVENOUS

## 2020-07-19 MED ORDER — ONDANSETRON 4 MG PO TBDP: 4.0000 mg | ORAL_TABLET | Freq: Three times a day (TID) | ORAL | 0 refills | Status: DC | PRN

## 2020-07-19 MED ORDER — HYDROMORPHONE HCL 1 MG/ML IJ SOLN
1.0000 mg | Freq: Once | INTRAMUSCULAR | Status: AC
  Administered 2020-07-19: 1 mg via INTRAVENOUS
  Filled 2020-07-19: qty 1

## 2020-07-19 MED ORDER — MORPHINE SULFATE (PF) 4 MG/ML IV SOLN
4.0000 mg | Freq: Once | INTRAVENOUS | Status: AC
  Administered 2020-07-19: 4 mg via INTRAVENOUS
  Filled 2020-07-19: qty 1

## 2020-07-19 MED ORDER — IOHEXOL 300 MG/ML  SOLN
100.0000 mL | Freq: Once | INTRAMUSCULAR | Status: AC | PRN
  Administered 2020-07-19: 100 mL via INTRAVENOUS

## 2020-07-19 MED ORDER — ONDANSETRON HCL 4 MG/2ML IJ SOLN
4.0000 mg | Freq: Once | INTRAMUSCULAR | Status: AC
  Administered 2020-07-19: 4 mg via INTRAVENOUS
  Filled 2020-07-19: qty 2

## 2020-07-19 NOTE — ED Provider Notes (Signed)
[facility] EMERGENCY DEPARTMENT Provider Note   CSN: [id number] Arrival date & time: 07/19/20  1606     History Chief Complaint  Patient presents with   Abdominal Pain    Kelli Hoover is a 30 y.o. female with pertinent past medical history of generalized anxiety disorder, right upper quadrant pain, acute appendicitis with appendectomy last year that presents to the emergency department today for generalized abdominal pain with right upper quadrant tenderness specifically.  Patient also admits to nausea vomiting, all this started today while she was at the pool.  Patient states that she is having right upper quadrant pain for a month, is intermittent and worse after she eats fried foods.  Patient states that pain became severe today, and is unbearable.  States that she has been vomiting nonstop, states that she noted she had some streaks of blood in her emesis the first couple times, no longer currently having this when she vomits.  Denies any diarrhea.  Denies any dysuria hematuria vaginal or pelvic complaints.  Abdominal pain is primarily in the right upper quadrant.  Per chart review patient had a right upper quadrant ultrasound done last year, did show questionable gallbladder polyp.  Patient denies any fevers or URI symptoms.  States that sometimes it will radiate to her back.  Denies any chest pain or shortness of breath.  No other complaints at this time.  HPI     Past Medical History:  Diagnosis Date   Migraine    Miscarriage    Panic attack     Patient Active Problem List   Diagnosis Date Noted   Right upper quadrant pain 02/28/2019   Gallbladder mass 02/28/2019   Constipation 02/28/2019   Nausea with vomiting 02/28/2019   Acute appendicitis    GAD (generalized anxiety disorder) 07/20/2014    Past Surgical History:  Procedure Laterality Date   DILATION AND CURETTAGE OF UTERUS     INDUCED ABORTION     LAPAROSCOPIC APPENDECTOMY N/A 07/13/2018   Procedure:  LAPAROSCOPIC APPENDECTOMY;  Surgeon: Franky Macho, MD;  Location: [facility];  Service: General;  Laterality: N/A;     OB History     Gravida  2   Para      Term      Preterm      AB      Living         SAB      IAB      Ectopic      Multiple      Live Births              Family History  Problem Relation Age of Onset   Hypertension Mother    Anxiety disorder Mother    Cancer Other    Hypertension Other    Anxiety disorder Sister    Anxiety disorder Maternal Grandmother    Colon cancer Maternal Grandmother        age greater than 32   Ulcers Paternal Aunt     Social History   Tobacco Use   Smoking status: Every Day    Packs/day: 1.00    Years: 7.00    Pack years: 7.00    Types: Cigarettes   Smokeless tobacco: Never  Substance Use Topics   Alcohol use: Not Currently   Drug use: No    Home Medications Prior to Admission medications   Medication Sig Start Date End Date Taking? Authorizing Provider  cephALEXin (KEFLEX) 500 MG capsule Take 1 capsule (  500 mg total) by mouth 4 (four) times daily for 7 days. 07/19/20 07/26/20 Yes Atiana Levier, PA-C  ondansetron (ZOFRAN ODT) 4 MG disintegrating tablet Take 1 tablet (4 mg total) by mouth every 8 (eight) hours as needed for nausea or vomiting. 07/19/20  Yes Farrel Gordon, PA-C  dexlansoprazole (DEXILANT) 60 MG capsule Take 1 capsule (60 mg total) by mouth daily before breakfast. 02/28/19   Tiffany Kocher, PA-C  linaclotide (LINZESS) 290 MCG CAPS capsule Take 1 capsule (290 mcg total) by mouth daily as needed. 02/28/19   Tiffany Kocher, PA-C  oxyCODONE-acetaminophen (PERCOCET) 5-325 MG tablet Take 1 tablet by mouth every 4 (four) hours as needed. 03/07/19   Donnetta Hutching, MD    Allergies    Erythromycin  Review of Systems   Review of Systems  Constitutional:  Negative for chills, diaphoresis, fatigue and fever.  HENT:  Negative for congestion, sore throat and trouble swallowing.   Eyes:  Negative for pain and  visual disturbance.  Respiratory:  Negative for cough, shortness of breath and wheezing.   Cardiovascular:  Negative for chest pain, palpitations and leg swelling.  Gastrointestinal:  Positive for abdominal pain, nausea and vomiting. Negative for abdominal distention and diarrhea.  Genitourinary:  Negative for difficulty urinating.  Musculoskeletal:  Negative for back pain, neck pain and neck stiffness.  Skin:  Negative for pallor.  Neurological:  Negative for dizziness, speech difficulty, weakness and headaches.  Psychiatric/Behavioral:  Negative for confusion.    Physical Exam Updated Vital Signs BP 106/64 (BP Location: Left Arm)   Pulse 70   Temp 98.7 F (37.1 C) (Oral)   Resp 18   Ht 5' (1.524 m)   Wt 52.6 kg   LMP 07/05/2020   SpO2 99%   BMI 22.65 kg/m   Physical Exam Constitutional:      General: She is not in acute distress.    Appearance: Normal appearance. She is not ill-appearing, toxic-appearing or diaphoretic.  HENT:     Mouth/Throat:     Mouth: Mucous membranes are moist.     Pharynx: Oropharynx is clear.  Eyes:     General: No scleral icterus.    Extraocular Movements: Extraocular movements intact.     Pupils: Pupils are equal, round, and reactive to light.  Cardiovascular:     Rate and Rhythm: Normal rate and regular rhythm.     Pulses: Normal pulses.     Heart sounds: Normal heart sounds.  Pulmonary:     Effort: Pulmonary effort is normal. No respiratory distress.     Breath sounds: Normal breath sounds. No stridor. No wheezing, rhonchi or rales.  Chest:     Chest wall: No tenderness.  Abdominal:     General: Abdomen is flat. There is no distension.     Palpations: Abdomen is soft.     Tenderness: There is generalized abdominal tenderness and tenderness in the right upper quadrant. There is no guarding or rebound.  Genitourinary:    Comments: Chaperone present.  Normal external vaginal exam.  Internal vaginal exam with normal vaginal canal, cervix  closed, no vaginal discharge.  Cervix is not friable.  Bimanual exam with no cervical motion tenderness or adnexal motion tenderness. Musculoskeletal:        General: No swelling or tenderness. Normal range of motion.     Cervical back: Normal range of motion and neck supple. No rigidity.     Right lower leg: No edema.     Left lower leg: No edema.  Skin:    General: Skin is warm and dry.     Capillary Refill: Capillary refill takes less than 2 seconds.     Coloration: Skin is not pale.  Neurological:     General: No focal deficit present.     Mental Status: She is alert and oriented to person, place, and time.  Psychiatric:        Mood and Affect: Mood normal.        Behavior: Behavior normal.    ED Results / Procedures / Treatments   Labs (all labs ordered are listed, but only abnormal results are displayed) Labs Reviewed  WET PREP, GENITAL - Abnormal; Notable for the following components:      Result Value   WBC, Wet Prep HPF POC MODERATE (*)    All other components within normal limits  URINALYSIS, ROUTINE W REFLEX MICROSCOPIC - Abnormal; Notable for the following components:   Specific Gravity, Urine >1.030 (*)    Hgb urine dipstick SMALL (*)    Nitrite POSITIVE (*)    All other components within normal limits  CBC WITH DIFFERENTIAL/PLATELET - Abnormal; Notable for the following components:   WBC 15.8 (*)    Hemoglobin 15.4 (*)    Neutro Abs 12.8 (*)    Abs Immature Granulocytes 0.09 (*)    All other components within normal limits  URINALYSIS, MICROSCOPIC (REFLEX) - Abnormal; Notable for the following components:   Bacteria, UA MANY (*)    All other components within normal limits  COMPREHENSIVE METABOLIC PANEL - Abnormal; Notable for the following components:   CO2 21 (*)    Glucose, Bld 111 (*)    Calcium 8.2 (*)    AST 13 (*)    All other components within normal limits  URINE CULTURE  PREGNANCY, URINE  LIPASE, BLOOD  GC/CHLAMYDIA PROBE AMP ([place] NOT  [facility]    EKG None  Radiology CT Abdomen Pelvis W Contrast  Result Date: 07/19/2020 CLINICAL DATA:  Lower abdominal pain, nausea and vomiting for 3 days EXAM: CT ABDOMEN AND PELVIS WITH CONTRAST TECHNIQUE: Multidetector CT imaging of the abdomen and pelvis was performed using the standard protocol following bolus administration of intravenous contrast. CONTRAST:  100mL OMNIPAQUE IOHEXOL 300 MG/ML  SOLN COMPARISON:  CT 02/22/2019 FINDINGS: Lower chest: Lung bases are clear. Normal heart size. No pericardial effusion. Hepatobiliary: No worrisome focal liver lesions. Smooth liver surface contour. Normal hepatic attenuation for phase of contrast timing. Stable 8 mm hyperdense focus towards the gallbladder fundus. No visible gallstone. No pericholecystic fluid or inflammation. No biliary ductal dilatation. Pancreas: No pancreatic ductal dilatation or surrounding inflammatory changes. Spleen: Normal in size. No concerning splenic lesions. Adrenals/Urinary Tract: Normal adrenals. No visible or contour deforming renal lesion. Bilateral extrarenal pelves with some conspicuous asymmetric layering attenuation within the left renal pelvis. No urolithiasis or hydronephrosis. Urinary bladder is unremarkable. Stomach/Bowel: Distal esophagus, stomach and duodenal sweep are unremarkable. Borderline air and fluid distention of the proximal jejunum with more normal caliber of the distal small bowel. No discrete transition point. No colonic thickening or dilatation. No other evidence of obstruction. The appendix is surgically absent. Vascular/Lymphatic: No significant vascular findings are present. No enlarged abdominal or pelvic lymph nodes. Reproductive: Peripherally enhancing, crenulated structure, likely involuting follicle, in the left adnexa measuring up to 1.4 cm in size. No concerning adnexal mass or lesion. Anteverted uterus. Subendometrial enhancement is often contrast timing related. Other: Trace low-attenuation  free fluid in the pelvis. No free air. No  bowel containing hernias. Musculoskeletal: Levocurvature lumbar spine. No acute osseous abnormality or suspicious osseous lesion. IMPRESSION: 1. Some asymmetric layering hyperattenuation within the left extrarenal pelvis, could reflect some early excretion of contrast versus debris. Recommend correlation with urinalysis. Homero Fellers urothelial thickening is less favored. No borderline air and fluid distended loops of proximal jejunum, possibly related to recent ingestion versus a focal ileus. No conspicuous wall thickening. No other sites of ileus or obstruction. 2. Likely collapsing follicle in the left ovary measuring 1.4 cm in size. No routine follow-up imaging is warranted. Note: This recommendation does not apply to those with increased risk (genetic, family history, elevated tumor markers or other high-risk factors) of ovarian cancer. Reference: JACR 2020 Feb; 17(2):248-254 3. No other acute or worrisome CT abnormality. Electronically Signed   By: Kreg Shropshire M.D.   On: 07/19/2020 19:02   US Abdomen Limited RUQ (LIVER/GB)  Result Date: 07/19/2020 CLINICAL DATA:  Right upper quadrant abdominal pain. EXAM: ULTRASOUND ABDOMEN LIMITED RIGHT UPPER QUADRANT COMPARISON:  February 23, 2019. FINDINGS: Gallbladder: No gallstones or wall thickening visualized. No sonographic Murphy sign noted by sonographer. Common bile duct: Diameter: 4 mm which is within normal limits. Liver: No focal lesion identified. Within normal limits in parenchymal echogenicity. Portal vein is patent on color Doppler imaging with normal direction of blood flow towards the liver. Other: None. IMPRESSION: No definite abnormality seen in the right upper quadrant of the abdomen. Electronically Signed   By: Lupita Raider M.D.   On: 07/19/2020 17:40    Procedures Procedures   Medications Ordered in ED Medications  sodium chloride 0.9 % bolus 1,000 mL (0 mLs Intravenous Stopped 07/19/20 1759)   ondansetron (ZOFRAN) injection 4 mg (4 mg Intravenous Given 07/19/20 1652)  morphine 4 MG/ML injection 4 mg (4 mg Intravenous Given 07/19/20 1652)  HYDROmorphone (DILAUDID) injection 1 mg (1 mg Intravenous Given 07/19/20 1728)  iohexol (OMNIPAQUE) 300 MG/ML solution 100 mL (100 mLs Intravenous Contrast Given 07/19/20 1819)    ED Course  I have reviewed the triage vital signs and the nursing notes.  Pertinent labs & imaging results that were available during my care of the patient were reviewed by me and considered in my medical decision making (see chart for details).    MDM Rules/Calculators/A&P                         Patient presents to the emerge department today for abdominal pain, specifically tender to the right upper quadrant.  Differential to include cholecystitis.  Initial inventions include fluids, Zofran and pain medication.  Will obtain right upper quadrant ultrasound and basic labs and reevaluate.  Work-up today shows unremarkable right upper quadrant ultrasound.  CBC with differential with leukocytosis of 15.8, I think this is most likely reactive from vomiting.  Otherwise work CBC and CMP unremarkable.  Urinalysis does appear as if patient does have UTI. She wants to be treated for this .   IMPRESSION:  1. Some asymmetric layering hyperattenuation within the left  extrarenal pelvis, could reflect some early excretion of contrast  versus debris. Recommend correlation with urinalysis. Homero Fellers  urothelial thickening is less favored. No borderline air and fluid  distended loops of proximal jejunum, possibly related to recent  ingestion versus a focal ileus. No conspicuous wall thickening. No  other sites of ileus or obstruction.  2. Likely collapsing follicle in the left ovary measuring 1.4 cm in  size. No routine follow-up imaging is warranted. Note:  This  recommendation does not apply to those with increased risk (genetic,  family history, elevated tumor markers or other  high-risk factors)  of ovarian cancer. Reference: JACR 2020 Feb; 17(2):248-254  3. No other acute or worrisome CT abnormality.   CT mostly unremarkable, do not favor focal ileus at this time.  Patient is complaining of right upper quadrant pain.  We will treat for UTI with antibiotics at this time especially with hyperattenuation within the left external pelvis.  Patient will follow up with PCP.  Symptomatic treatment discussed in regards to soft diet, nausea medications.  Also refer to GI doctor for further follow-up.  Patient requesting pelvic at this time, discussed that patient does not have any pain in her lower quadrants or any pelvic symptoms, however wants to be STD tested.  Pelvic exam benign.  Wet prep without trichomonas.  STD test pending.  Upon reevaluation, patient states that she feels better with fluids, nausea and pain medication.  Repeat right upper quadrant exam much improved, do think that patient has acute cholecystitis with normal right upper quadrant and normal CT imaging.  Normal LFTs.  We will have patient follow-up with GI doctor since patient's been having pain for a while now.  Patient passed p.o. challenge, happily drinking ginger ale.  Patient discharged at this time.  Doubt need for further emergent work up at this time. I explained the diagnosis and have given explicit precautions to return to the ER including for any other new or worsening symptoms. The patient understands and accepts the medical plan as it's been dictated and I have answered their questions. Discharge instructions concerning home care and prescriptions have been given. The patient is STABLE and is discharged to home in good condition.  Final Clinical Impression(s) / ED Diagnoses Final diagnoses:  RUQ pain  Acute cystitis with hematuria    Rx / DC Orders ED Discharge Orders          Ordered    cephALEXin (KEFLEX) 500 MG capsule  4 times daily        07/19/20 1916    ondansetron (ZOFRAN ODT) 4  MG disintegrating tablet  Every 8 hours PRN        07/19/20 1920             Farrel Gordon, PA-C 07/19/20 1927    Virgina Norfolk, DO 07/19/20 2322

## 2020-07-19 NOTE — ED Triage Notes (Signed)
C/o lower abd pain  and n/v x 3 days

## 2020-07-19 NOTE — Discharge Instructions (Addendum)
  You were evaluated in the Emergency Department and after careful evaluation, we did not find any emergent condition requiring admission or further testing in the hospital.   Your exam/testing today was overall reassuring.  Symptoms could be due to a urinary tract infection, I do want you to take the antibiotics as prescribed.  Make sure to stay hydrated.  I also want you to use the nausea medication when you feel nauseous.  Please stick to a bland diet and advance your diet slowly.  Use the attached instructions.  I also referred you to a GI doctor that he can follow-up with if her symptoms persist.  Your gonorrhea chlamydia testing will come up on MyChart in the next 48 hours, please keep an eye on these.  If they are positive you will need to call back to be treated for this. Please return to the Emergency Department if you experience any worsening of your condition.  Thank you for allowing [place] to be a part of your care. Please speak to your pharmacist about any new medications prescribed today in regards to side effects or interactions with other medications.

## 2020-07-20 ENCOUNTER — Other Ambulatory Visit: Payer: Self-pay

## 2020-07-20 ENCOUNTER — Encounter (HOSPITAL_BASED_OUTPATIENT_CLINIC_OR_DEPARTMENT_OTHER): Payer: Self-pay

## 2020-07-20 ENCOUNTER — Emergency Department (HOSPITAL_BASED_OUTPATIENT_CLINIC_OR_DEPARTMENT_OTHER)
Admission: EM | Admit: 2020-07-20 | Discharge: 2020-07-21 | Disposition: A | Discharge status: Home / Self Care | Payer: Self-pay | Attending: Emergency Medicine | Admitting: Emergency Medicine

## 2020-07-20 DIAGNOSIS — B9689 Other specified bacterial agents as the cause of diseases classified elsewhere: Secondary | ICD-10-CM | POA: Insufficient documentation

## 2020-07-20 DIAGNOSIS — R1013 Epigastric pain: Secondary | ICD-10-CM

## 2020-07-20 DIAGNOSIS — F17210 Nicotine dependence, cigarettes, uncomplicated: Secondary | ICD-10-CM | POA: Insufficient documentation

## 2020-07-20 DIAGNOSIS — N390 Urinary tract infection, site not specified: Secondary | ICD-10-CM

## 2020-07-20 LAB — URINALYSIS, ROUTINE W REFLEX MICROSCOPIC
Bilirubin Urine: NEGATIVE
Glucose, UA: NEGATIVE mg/dL
Hgb urine dipstick: NEGATIVE
Ketones, ur: NEGATIVE mg/dL
Nitrite: POSITIVE — AB
Protein, ur: NEGATIVE mg/dL
Specific Gravity, Urine: 1.02 (ref 1.005–1.030)
pH: 6 (ref 5.0–8.0)

## 2020-07-20 LAB — URINALYSIS, MICROSCOPIC (REFLEX)

## 2020-07-20 LAB — GC/CHLAMYDIA PROBE AMP (~~LOC~~) NOT AT ARMC
Chlamydia: NEGATIVE
Comment: NEGATIVE
Comment: NORMAL
Neisseria Gonorrhea: NEGATIVE

## 2020-07-20 LAB — PREGNANCY, URINE: Preg Test, Ur: NEGATIVE

## 2020-07-20 NOTE — ED Triage Notes (Addendum)
Pt c/o abd pain, n/v x 4 days-states she was here yesterday for same-NAD-steady gait

## 2020-07-21 LAB — COMPREHENSIVE METABOLIC PANEL
ALT: 10 U/L (ref 0–44)
AST: 18 U/L (ref 15–41)
Albumin: 4.3 g/dL (ref 3.5–5.0)
Alkaline Phosphatase: 64 U/L (ref 38–126)
Anion gap: 10 (ref 5–15)
BUN: 10 mg/dL (ref 6–20)
CO2: 23 mmol/L (ref 22–32)
Calcium: 9.1 mg/dL (ref 8.9–10.3)
Chloride: 104 mmol/L (ref 98–111)
Creatinine, Ser: 0.72 mg/dL (ref 0.44–1.00)
GFR, Estimated: >60 mL/min (ref >60)
Glucose, Bld: 109 mg/dL — ABNORMAL HIGH (ref 70–99)
Potassium: 3.6 mmol/L (ref 3.5–5.1)
Sodium: 137 mmol/L (ref 135–145)
Total Bilirubin: 0.5 mg/dL (ref 0.3–1.2)
Total Protein: 7.9 g/dL (ref 6.5–8.1)

## 2020-07-21 LAB — CBC WITH DIFFERENTIAL/PLATELET
Abs Immature Granulocytes: 0.08 10*3/uL — ABNORMAL HIGH (ref 0.00–0.07)
Basophils Absolute: 0 10*3/uL (ref 0.0–0.1)
Basophils Relative: 0 %
Eosinophils Absolute: 0 10*3/uL (ref 0.0–0.5)
Eosinophils Relative: 0 %
HCT: 43.5 % (ref 36.0–46.0)
Hemoglobin: 15.1 g/dL — ABNORMAL HIGH (ref 12.0–15.0)
Immature Granulocytes: 1 %
Lymphocytes Relative: 18 %
Lymphs Abs: 2.4 10*3/uL (ref 0.7–4.0)
MCH: 31 pg (ref 26.0–34.0)
MCHC: 34.7 g/dL (ref 30.0–36.0)
MCV: 89.3 fL (ref 80.0–100.0)
Monocytes Absolute: 0.4 10*3/uL (ref 0.1–1.0)
Monocytes Relative: 3 %
Neutro Abs: 10.7 10*3/uL — ABNORMAL HIGH (ref 1.7–7.7)
Neutrophils Relative %: 78 %
Platelets: 234 10*3/uL (ref 150–400)
RBC: 4.87 MIL/uL (ref 3.87–5.11)
RDW: 13.3 % (ref 11.5–15.5)
WBC: 13.6 10*3/uL — ABNORMAL HIGH (ref 4.0–10.5)
nRBC: 0 % (ref 0.0–0.2)

## 2020-07-21 LAB — LIPASE, BLOOD: Lipase: 26 U/L (ref 11–51)

## 2020-07-21 MED ORDER — PANTOPRAZOLE SODIUM 40 MG PO TBEC: DELAYED_RELEASE_TABLET | ORAL | 0 refills | Status: DC

## 2020-07-21 MED ORDER — SUCRALFATE 1 G PO TABS: 1.0000 g | ORAL_TABLET | Freq: Three times a day (TID) | ORAL | 0 refills | Status: DC

## 2020-07-21 MED ORDER — SODIUM CHLORIDE 0.9 % IV SOLN
1.0000 g | Freq: Once | INTRAVENOUS | Status: AC
  Administered 2020-07-21: 1 g via INTRAVENOUS
  Filled 2020-07-21: qty 10

## 2020-07-21 MED ORDER — HYDROMORPHONE HCL 1 MG/ML IJ SOLN
1.0000 mg | Freq: Once | INTRAMUSCULAR | Status: AC
Start: 2020-07-21 — End: 2020-07-21
  Administered 2020-07-21: 1 mg via INTRAVENOUS
  Filled 2020-07-21: qty 1

## 2020-07-21 MED ORDER — SUCRALFATE 1 GM/10ML PO SUSP
1.0000 g | Freq: Once | ORAL | Status: AC
  Administered 2020-07-21: 1 g via ORAL
  Filled 2020-07-21: qty 10

## 2020-07-21 MED ORDER — ONDANSETRON HCL 4 MG/2ML IJ SOLN
4.0000 mg | Freq: Once | INTRAMUSCULAR | Status: AC
  Administered 2020-07-21: 4 mg via INTRAVENOUS
  Filled 2020-07-21: qty 2

## 2020-07-21 MED ORDER — PANTOPRAZOLE SODIUM 40 MG IV SOLR
40.0000 mg | Freq: Once | INTRAVENOUS | Status: AC
  Administered 2020-07-21: 40 mg via INTRAVENOUS
  Filled 2020-07-21: qty 40

## 2020-07-21 NOTE — ED Provider Notes (Signed)
[facility] Provider Note: Lowella DellJ. Lane Vasili Fok, MD, FACEP  CSN: [id number] MRN: [id number] ARRIVAL: 07/20/20 at 2215 ROOM: MH02/MH02   CHIEF COMPLAINT  Abdominal Pain   HISTORY OF PRESENT ILLNESS  07/21/20 12:25 AM Kelli Hoover is a 30 y.o. female who was seen in the ED 2 days ago for generalized abdominal pain with right upper quadrant tenderness.  She had associated nausea and vomiting.  The right upper quadrant pain had been occurring off and on for about a month and worse after she eats fried foods.  She did not have any urinary or pelvic complaints.  CT scan showed a collapsing follicle of the left ovary and some hyperattenuation within the left extrarenal pelvis.  Ultrasound of the right upper quadrant showed no acute pathology.  No stones were seen.  Urinalysis was positive for pyuria and many bacteria and she was started on Keflex and Zofran.  Urine culture has not yet resulted.  GC and Chlamydia were negative.  She returns with persistent epigastric pain which she rates as a 10 out of 10.  The pain is worse with movement or palpation and radiates into her back.  She has had associated nausea and vomiting.  She was not able to hold down her Zofran and is not sure if she was able to take all of her antibiotic.  She is having urinary frequency but not dysuria.  Her stools have been more foul-smelling than usual but she states she has not had a much of a bowel movement in several days.   Past Medical History:  Diagnosis Date   Migraine    Miscarriage    Panic attack     Past Surgical History:  Procedure Laterality Date   APPENDECTOMY     DILATION AND CURETTAGE OF UTERUS     INDUCED ABORTION     LAPAROSCOPIC APPENDECTOMY N/A 07/13/2018   Procedure: LAPAROSCOPIC APPENDECTOMY;  Surgeon: Franky MachoJenkins, Mark, MD;  Location: [facility];  Service: General;  Laterality: N/A;    Family History  Problem Relation Age of Onset   Hypertension Mother    Anxiety disorder Mother    Cancer  Other    Hypertension Other    Anxiety disorder Sister    Anxiety disorder Maternal Grandmother    Colon cancer Maternal Grandmother        age greater than 7060   Ulcers Paternal Aunt     Social History   Tobacco Use   Smoking status: Every Day    Packs/day: 1.00    Years: 7.00    Pack years: 7.00    Types: Cigarettes   Smokeless tobacco: Never  Substance Use Topics   Alcohol use: Not Currently   Drug use: No    Prior to Admission medications   Medication Sig Start Date End Date Taking? Authorizing Provider  pantoprazole (PROTONIX) 40 MG tablet Take 1 tablet every morning at least 30 minutes before first dose of Carafate. 07/21/20  Yes Chandria Rookstool, MD  sucralfate (CARAFATE) 1 g tablet Take 1 tablet (1 g total) by mouth 4 (four) times daily -  with meals and at bedtime. 07/21/20  Yes Delayna Sparlin, MD  cephALEXin (KEFLEX) 500 MG capsule Take 1 capsule (500 mg total) by mouth 4 (four) times daily for 7 days. 07/19/20 07/26/20  Farrel GordonPatel, Shalyn, PA-C  ondansetron (ZOFRAN ODT) 4 MG disintegrating tablet Take 1 tablet (4 mg total) by mouth every 8 (eight) hours as needed for nausea or vomiting. 07/19/20   Allena KatzPatel,  Shalyn, PA-C    Allergies Erythromycin   REVIEW OF SYSTEMS  Negative except as noted here or in the History of Present Illness.   PHYSICAL EXAMINATION  Initial Vital Signs Blood pressure (!) 151/88, pulse 64, temperature 99.2 F (37.3 C), temperature source Oral, resp. rate 20, height 5' (1.524 m), weight 52.6 kg, last menstrual period 07/05/2020, SpO2 100 %, unknown if currently breastfeeding.  Examination General: Well-developed, well-nourished female in no acute distress; appearance consistent with age of record HENT: normocephalic; atraumatic Eyes: Normal appearance Neck: supple Heart: regular rate and rhythm Lungs: clear to auscultation bilaterally Abdomen: soft; nondistended; epigastric tenderness; bowel sounds present GU: Mild right CVA tenderness Extremities: No  deformity; full range of motion Neurologic: Awake, alert and oriented; motor function intact in all extremities and symmetric; no facial droop Skin: Warm and dry Psychiatric: Grimacing   RESULTS  Summary of this visit's results, reviewed and interpreted by myself:   EKG Interpretation  Date/Time:    Ventricular Rate:    PR Interval:    QRS Duration:   QT Interval:    QTC Calculation:   R Axis:     Text Interpretation:          Laboratory Studies: Results for orders placed or performed during the hospital encounter of 07/20/20 (from the past 24 hour(s))  Urinalysis, Routine w reflex microscopic Urine, Clean Catch     Status: Abnormal   Collection Time: 07/20/20 10:53 PM  Result Value Ref Range   Color, Urine YELLOW YELLOW   APPearance HAZY (A) CLEAR   Specific Gravity, Urine 1.020 1.005 - 1.030   pH 6.0 5.0 - 8.0   Glucose, UA NEGATIVE NEGATIVE mg/dL   Hgb urine dipstick NEGATIVE NEGATIVE   Bilirubin Urine NEGATIVE NEGATIVE   Ketones, ur NEGATIVE NEGATIVE mg/dL   Protein, ur NEGATIVE NEGATIVE mg/dL   Nitrite POSITIVE (A) NEGATIVE   Leukocytes,Ua TRACE (A) NEGATIVE  Pregnancy, urine     Status: None   Collection Time: 07/20/20 10:53 PM  Result Value Ref Range   Preg Test, Ur NEGATIVE NEGATIVE  Urinalysis, Microscopic (reflex)     Status: Abnormal   Collection Time: 07/20/20 10:53 PM  Result Value Ref Range   RBC / HPF 0-5 0 - 5 RBC/hpf   WBC, UA 21-50 0 - 5 WBC/hpf   Bacteria, UA FEW (A) NONE SEEN   Squamous Epithelial / LPF 0-5 0 - 5  CBC with Differential/Platelet     Status: Abnormal   Collection Time: 07/21/20 12:49 AM  Result Value Ref Range   WBC 13.6 (H) 4.0 - 10.5 K/uL   RBC 4.87 3.87 - 5.11 MIL/uL   Hemoglobin 15.1 (H) 12.0 - 15.0 g/dL   HCT 76.1 60.7 - 37.1 %   MCV 89.3 80.0 - 100.0 fL   MCH 31.0 26.0 - 34.0 pg   MCHC 34.7 30.0 - 36.0 g/dL   RDW 06.2 69.4 - 85.4 %   Platelets 234 150 - 400 K/uL   nRBC 0.0 0.0 - 0.2 %   Neutrophils Relative % 78  %   Neutro Abs 10.7 (H) 1.7 - 7.7 K/uL   Lymphocytes Relative 18 %   Lymphs Abs 2.4 0.7 - 4.0 K/uL   Monocytes Relative 3 %   Monocytes Absolute 0.4 0.1 - 1.0 K/uL   Eosinophils Relative 0 %   Eosinophils Absolute 0.0 0.0 - 0.5 K/uL   Basophils Relative 0 %   Basophils Absolute 0.0 0.0 - 0.1 K/uL   Immature  Granulocytes 1 %   Abs Immature Granulocytes 0.08 (H) 0.00 - 0.07 K/uL  Comprehensive metabolic panel     Status: Abnormal   Collection Time: 07/21/20 12:49 AM  Result Value Ref Range   Sodium 137 135 - 145 mmol/L   Potassium 3.6 3.5 - 5.1 mmol/L   Chloride 104 98 - 111 mmol/L   CO2 23 22 - 32 mmol/L   Glucose, Bld 109 (H) 70 - 99 mg/dL   BUN 10 6 - 20 mg/dL   Creatinine, Ser 1.44 0.44 - 1.00 mg/dL   Calcium 9.1 8.9 - 31.5 mg/dL   Total Protein 7.9 6.5 - 8.1 g/dL   Albumin 4.3 3.5 - 5.0 g/dL   AST 18 15 - 41 U/L   ALT 10 0 - 44 U/L   Alkaline Phosphatase 64 38 - 126 U/L   Total Bilirubin 0.5 0.3 - 1.2 mg/dL   GFR, Estimated >40 >08 mL/min   Anion gap 10 5 - 15  Lipase, blood     Status: None   Collection Time: 07/21/20 12:49 AM  Result Value Ref Range   Lipase 26 11 - 51 U/L   Imaging Studies: CT Abdomen Pelvis W Contrast  Result Date: 07/19/2020 CLINICAL DATA:  Lower abdominal pain, nausea and vomiting for 3 days EXAM: CT ABDOMEN AND PELVIS WITH CONTRAST TECHNIQUE: Multidetector CT imaging of the abdomen and pelvis was performed using the standard protocol following bolus administration of intravenous contrast. CONTRAST:  OMNIPAQUE IOHEXOL 300 MG/ML  SOLN COMPARISON:  CT 02/22/2019 FINDINGS: Lower chest: Lung bases are clear. Normal heart size. No pericardial effusion. Hepatobiliary: No worrisome focal liver lesions. Smooth liver surface contour. Normal hepatic attenuation for phase of contrast timing. Stable 8 mm hyperdense focus towards the gallbladder fundus. No visible gallstone. No pericholecystic fluid or inflammation. No biliary ductal dilatation. Pancreas: No  pancreatic ductal dilatation or surrounding inflammatory changes. Spleen: Normal in size. No concerning splenic lesions. Adrenals/Urinary Tract: Normal adrenals. No visible or contour deforming renal lesion. Bilateral extrarenal pelves with some conspicuous asymmetric layering attenuation within the left renal pelvis. No urolithiasis or hydronephrosis. Urinary bladder is unremarkable. Stomach/Bowel: Distal esophagus, stomach and duodenal sweep are unremarkable. Borderline air and fluid distention of the proximal jejunum with more normal caliber of the distal small bowel. No discrete transition point. No colonic thickening or dilatation. No other evidence of obstruction. The appendix is surgically absent. Vascular/Lymphatic: No significant vascular findings are present. No enlarged abdominal or pelvic lymph nodes. Reproductive: Peripherally enhancing, crenulated structure, likely involuting follicle, in the left adnexa measuring up to 1.4 cm in size. No concerning adnexal mass or lesion. Anteverted uterus. Subendometrial enhancement is often contrast timing related. Other: Trace low-attenuation free fluid in the pelvis. No free air. No bowel containing hernias. Musculoskeletal: Levocurvature lumbar spine. No acute osseous abnormality or suspicious osseous lesion. IMPRESSION: 1. Some asymmetric layering hyperattenuation within the left extrarenal pelvis, could reflect some early excretion of contrast versus debris. Recommend correlation with urinalysis. Homero Fellers urothelial thickening is less favored. No borderline air and fluid distended loops of proximal jejunum, possibly related to recent ingestion versus a focal ileus. No conspicuous wall thickening. No other sites of ileus or obstruction. 2. Likely collapsing follicle in the left ovary measuring 1.4 cm in size. No routine follow-up imaging is warranted. Note: This recommendation does not apply to those with increased risk (genetic, family history, elevated tumor  markers or other high-risk factors) of ovarian cancer. Reference: JACR 2020 Feb; 17(2):248-254 3. No other  acute or worrisome CT abnormality. Electronically Signed   By: Kreg Shropshire M.D.   On: 07/19/2020 19:02   US Abdomen Limited RUQ (LIVER/GB)  Result Date: 07/19/2020 CLINICAL DATA:  Right upper quadrant abdominal pain. EXAM: ULTRASOUND ABDOMEN LIMITED RIGHT UPPER QUADRANT COMPARISON:  February 23, 2019. FINDINGS: Gallbladder: No gallstones or wall thickening visualized. No sonographic Murphy sign noted by sonographer. Common bile duct: Diameter: 4 mm which is within normal limits. Liver: No focal lesion identified. Within normal limits in parenchymal echogenicity. Portal vein is patent on color Doppler imaging with normal direction of blood flow towards the liver. Other: None. IMPRESSION: No definite abnormality seen in the right upper quadrant of the abdomen. Electronically Signed   By: Lupita Raider M.D.   On: 07/19/2020 17:40    ED COURSE and MDM  Nursing notes, initial and subsequent vitals signs, including pulse oximetry, reviewed and interpreted by myself.  Vitals:   07/20/20 2226 07/21/20 0049 07/21/20 0130 07/21/20 0230  BP:  134/83 97/60 98/62   Pulse:  67 63 60  Resp:  18 18 16   Temp:      TempSrc:      SpO2:  100% 97% 96%  Weight: 52.6 kg     Height: 5' (1.524 m)      Medications  pantoprazole (PROTONIX) injection 40 mg (has no administration in time range)  sucralfate (CARAFATE) 1 GM/10ML suspension 1 g (has no administration in time range)  ondansetron (ZOFRAN) injection 4 mg (4 mg Intravenous Given 07/21/20 0054)  HYDROmorphone (DILAUDID) injection 1 mg (1 mg Intravenous Given 07/21/20 0055)  cefTRIAXone (ROCEPHIN) 1 g in sodium chloride 0.9 % 100 mL IVPB (0 g Intravenous Stopped 07/21/20 0131)   Patient given Rocephin IV for persistent urinary tract infection.  Pain is significantly improved with IV Dilaudid.  This would not explain her epigastric pain.  As noted above her  ultrasound showed no gallstones so I doubt if it is biliary colic.  The location is consistent with pancreatitis but her lipase and CT scan were normal.  It could also be gastritis or peptic ulcer disease which would explain why the patient tells me her pain was intermittent but became severe when she tried to eat regular food.  We will start her on treatment for gastritis/PUD and have her continue Keflex pending results of urine culture.   PROCEDURES  Procedures   ED DIAGNOSES     ICD-10-CM   1. Epigastric pain  R10.13     2. Lower urinary tract infectious disease  N39.0          Jarely Juncaj, MD 07/21/20 [phone]

## 2020-07-21 NOTE — ED Notes (Signed)
ED Provider at bedside. 

## 2020-07-22 LAB — URINE CULTURE: Culture: >=100000 — AB

## 2020-07-23 ENCOUNTER — Telehealth: Payer: Self-pay

## 2020-07-23 NOTE — Telephone Encounter (Signed)
Post ED Visit - Positive Culture Follow-up  Culture report reviewed by antimicrobial stewardship pharmacist: [facility] [x]  , Pharm.D. []  Loleta Dicker, Pharm.D., BCPS AQ-ID []  , Pharm.D., BCPS []  Celedonio Miyamoto, Pharm.D., BCPS []  [place], Garvin Fila.D., BCPS, AAHIVP []  , Pharm.D., BCPS, AAHIVP []  Georgina Pillion, PharmD, BCPS []  , PharmD, BCPS []  [place], PharmD, BCPS []  [address], PharmD []  , PharmD, BCPS []  Estella Husk, PharmD  Pharmacy Team []  Lysle Pearl, PharmD []  , PharmD []  Phillips Climes, PharmD []  , Rph []  Agapito Games) , PharmD []  Verlan Friends, PharmD []  , PharmD []  Mervyn Gay, PharmD []  , PharmD []  Vinnie Level, PharmD []  [facility], PharmD []  , PharmD []  Len Childs, PharmD   Positive urine culture Treated with cephalexin, organism sensitive to the same and no further patient follow-up is required at this time.  07/23/2020, 8:43 AM

## 2020-07-31 ENCOUNTER — Encounter (HOSPITAL_BASED_OUTPATIENT_CLINIC_OR_DEPARTMENT_OTHER): Payer: Self-pay | Admitting: *Deleted

## 2020-07-31 ENCOUNTER — Other Ambulatory Visit: Payer: Self-pay

## 2020-07-31 DIAGNOSIS — F17210 Nicotine dependence, cigarettes, uncomplicated: Secondary | ICD-10-CM | POA: Insufficient documentation

## 2020-07-31 DIAGNOSIS — M545 Low back pain, unspecified: Secondary | ICD-10-CM | POA: Insufficient documentation

## 2020-07-31 DIAGNOSIS — E876 Hypokalemia: Secondary | ICD-10-CM | POA: Insufficient documentation

## 2020-07-31 DIAGNOSIS — F12188 Cannabis abuse with other cannabis-induced disorder: Secondary | ICD-10-CM | POA: Insufficient documentation

## 2020-07-31 LAB — CBC
HCT: 40.2 % (ref 36.0–46.0)
Hemoglobin: 14.1 g/dL (ref 12.0–15.0)
MCH: 31.4 pg (ref 26.0–34.0)
MCHC: 35.1 g/dL (ref 30.0–36.0)
MCV: 89.5 fL (ref 80.0–100.0)
Platelets: 341 10*3/uL (ref 150–400)
RBC: 4.49 MIL/uL (ref 3.87–5.11)
RDW: 14.6 % (ref 11.5–15.5)
WBC: 12.7 10*3/uL — ABNORMAL HIGH (ref 4.0–10.5)
nRBC: 0 % (ref 0.0–0.2)

## 2020-07-31 LAB — COMPREHENSIVE METABOLIC PANEL
ALT: 16 U/L (ref 0–44)
AST: 16 U/L (ref 15–41)
Albumin: 4.1 g/dL (ref 3.5–5.0)
Alkaline Phosphatase: 55 U/L (ref 38–126)
Anion gap: 10 (ref 5–15)
BUN: 5 mg/dL — ABNORMAL LOW (ref 6–20)
CO2: 24 mmol/L (ref 22–32)
Calcium: 8.5 mg/dL — ABNORMAL LOW (ref 8.9–10.3)
Chloride: 100 mmol/L (ref 98–111)
Creatinine, Ser: 0.48 mg/dL (ref 0.44–1.00)
GFR, Estimated: >60 mL/min (ref >60)
Glucose, Bld: 109 mg/dL — ABNORMAL HIGH (ref 70–99)
Potassium: 3.2 mmol/L — ABNORMAL LOW (ref 3.5–5.1)
Sodium: 134 mmol/L — ABNORMAL LOW (ref 135–145)
Total Bilirubin: 0.5 mg/dL (ref 0.3–1.2)
Total Protein: 7.1 g/dL (ref 6.5–8.1)

## 2020-07-31 LAB — URINALYSIS, ROUTINE W REFLEX MICROSCOPIC
Bilirubin Urine: NEGATIVE
Glucose, UA: NEGATIVE mg/dL
Hgb urine dipstick: NEGATIVE
Ketones, ur: NEGATIVE mg/dL
Leukocytes,Ua: NEGATIVE
Nitrite: NEGATIVE
Protein, ur: NEGATIVE mg/dL
Specific Gravity, Urine: 1.015 (ref 1.005–1.030)
pH: 8.5 — ABNORMAL HIGH (ref 5.0–8.0)

## 2020-07-31 LAB — URINALYSIS, MICROSCOPIC (REFLEX)

## 2020-07-31 LAB — LIPASE, BLOOD: Lipase: 26 U/L (ref 11–51)

## 2020-07-31 LAB — PREGNANCY, URINE: Preg Test, Ur: NEGATIVE

## 2020-07-31 NOTE — ED Triage Notes (Signed)
Abdominal pain x 2 weeks. Vomiting. Diarrhea.

## 2020-08-01 ENCOUNTER — Emergency Department (HOSPITAL_BASED_OUTPATIENT_CLINIC_OR_DEPARTMENT_OTHER): Payer: Self-pay

## 2020-08-01 ENCOUNTER — Emergency Department (HOSPITAL_BASED_OUTPATIENT_CLINIC_OR_DEPARTMENT_OTHER)
Admission: EM | Admit: 2020-08-01 | Discharge: 2020-08-01 | Disposition: A | Discharge status: Home / Self Care | Payer: Self-pay | Attending: Emergency Medicine | Admitting: Emergency Medicine

## 2020-08-01 DIAGNOSIS — E876 Hypokalemia: Secondary | ICD-10-CM

## 2020-08-01 DIAGNOSIS — F12188 Cannabis abuse with other cannabis-induced disorder: Secondary | ICD-10-CM

## 2020-08-01 LAB — RAPID URINE DRUG SCREEN, HOSP PERFORMED
Amphetamines: NOT DETECTED
Barbiturates: NOT DETECTED
Benzodiazepines: NOT DETECTED
Cocaine: NOT DETECTED
Opiates: NOT DETECTED
Tetrahydrocannabinol: POSITIVE — AB

## 2020-08-01 MED ORDER — HALOPERIDOL 5 MG PO TABS: ORAL_TABLET | ORAL | 0 refills | Status: DC

## 2020-08-01 MED ORDER — ONDANSETRON 4 MG PO TBDP
8.0000 mg | ORAL_TABLET | Freq: Once | ORAL | Status: AC
  Administered 2020-08-01: 8 mg via ORAL
  Filled 2020-08-01: qty 2

## 2020-08-01 MED ORDER — POTASSIUM CHLORIDE CRYS ER 20 MEQ PO TBCR
40.0000 meq | EXTENDED_RELEASE_TABLET | Freq: Once | ORAL | Status: AC
  Administered 2020-08-01: 40 meq via ORAL
  Filled 2020-08-01: qty 2

## 2020-08-01 MED ORDER — HALOPERIDOL LACTATE 5 MG/ML IJ SOLN
5.0000 mg | Freq: Once | INTRAMUSCULAR | Status: AC
  Administered 2020-08-01: 5 mg via INTRAMUSCULAR
  Filled 2020-08-01: qty 1

## 2020-08-01 NOTE — ED Provider Notes (Signed)
[facility] Provider Note: Kelli Dell, MD, FACEP  CSN: [id number] MRN: [id number] ARRIVAL: 07/31/20 at 2054 ROOM: MH06/MH06   CHIEF COMPLAINT  Vomiting   HISTORY OF PRESENT ILLNESS  08/01/20 2:04 AM Kelli Hoover is a 30 y.o. female who is here with her fifth emergency department visit since 07/19/2020 for abdominal pain and associated symptoms.  She states she has been having abdominal pain for about 2 weeks.  She describes the pain as a burning pain, generalized and constant.  It is not crampy.  She also has pain in her lower back.  She has had associated nausea and vomiting initially but now has not had a bowel movement in about a week despite using an enema on herself.  On her visit on 07/19/2020 she was diagnosed with a urinary tract infection and culture grew out pansensitive Escherichia coli.  She was treated with Keflex.  On 07/21/2020 her urinalysis showed her urinary tract infection was largely resolved and her urinalysis here shows no evidence of an active urinary tract infection.  Urine drug screen performed on 07/21/2020 was positive for opiates and cannabis.  CT of the abdomen and pelvis on 07/19/2020 showed likely debris in the left extrarenal pelvis and of collapsing follicle of the left ovary but no other significant findings.  The renal pelvis findings are consistent with her urinary tract infection.  A right upper quadrant ultrasound done at the same time showed no definite abnormality.   Past Medical History:  Diagnosis Date   Migraine    Miscarriage    Panic attack     Past Surgical History:  Procedure Laterality Date   APPENDECTOMY     DILATION AND CURETTAGE OF UTERUS     INDUCED ABORTION     LAPAROSCOPIC APPENDECTOMY N/A 07/13/2018   Procedure: LAPAROSCOPIC APPENDECTOMY;  Surgeon: Franky Macho, MD;  Location: [facility];  Service: General;  Laterality: N/A;    Family History  Problem Relation Age of Onset   Hypertension Mother    Anxiety disorder  Mother    Cancer Other    Hypertension Other    Anxiety disorder Sister    Anxiety disorder Maternal Grandmother    Colon cancer Maternal Grandmother        age greater than 19   Ulcers Paternal Aunt     Social History   Tobacco Use   Smoking status: Every Day    Packs/day: 1.00    Years: 7.00    Pack years: 7.00    Types: Cigarettes   Smokeless tobacco: Never  Vaping Use   Vaping Use: Never used  Substance Use Topics   Alcohol use: Not Currently   Drug use: Yes    Types: Marijuana    Prior to Admission medications   Medication Sig Start Date End Date Taking? Authorizing Provider  haloperidol (HALDOL) 5 MG tablet Take 1 tablet twice daily as needed for cannabis hyperemesis syndrome. 08/01/20  Yes Tamie Minteer, Jonny Ruiz, MD  ondansetron (ZOFRAN ODT) 4 MG disintegrating tablet Take 1 tablet (4 mg total) by mouth every 8 (eight) hours as needed for nausea or vomiting. 07/19/20  Yes Farrel Gordon, PA-C  pantoprazole (PROTONIX) 40 MG tablet Take 1 tablet every morning at least 30 minutes before first dose of Carafate. 07/21/20   Weylyn Ricciuti, MD  sucralfate (CARAFATE) 1 g tablet Take 1 tablet (1 g total) by mouth 4 (four) times daily -  with meals and at bedtime. 07/21/20   Raymone Pembroke, Jonny Ruiz, MD  Allergies Erythromycin   REVIEW OF SYSTEMS  Negative except as noted here or in the History of Present Illness.   PHYSICAL EXAMINATION  Initial Vital Signs Blood pressure 131/71, pulse 68, temperature 98.6 F (37 C), temperature source Oral, resp. rate 18, height 5' (1.524 m), weight 52.6 kg, last menstrual period 07/24/2020, SpO2 98 %, unknown if currently breastfeeding.  Examination General: Well-developed, well-nourished female in no acute distress; appearance consistent with age of record HENT: normocephalic; atraumatic Eyes: pupils equal, round and reactive to light; extraocular muscles intact Neck: supple Heart: regular rate and rhythm Lungs: clear to auscultation bilaterally Abdomen:  soft; nondistended; diffusely tender; bowel sounds present Extremities: No deformity; full range of motion; pulses normal Neurologic: Awake, alert and oriented; motor function intact in all extremities and symmetric; no facial droop Skin: Warm and dry Psychiatric: Repeatedly begging for pain medication   RESULTS  Summary of this visit's results, reviewed and interpreted by myself:   EKG Interpretation  Date/Time:    Ventricular Rate:    PR Interval:    QRS Duration:   QT Interval:    QTC Calculation:   R Axis:     Text Interpretation:          Laboratory Studies: Results for orders placed or performed during the hospital encounter of 08/01/20 (from the past 24 hour(s))  Urinalysis, Routine w reflex microscopic Urine, Clean Catch     Status: Abnormal   Collection Time: 07/31/20  9:06 PM  Result Value Ref Range   Color, Urine YELLOW YELLOW   APPearance TURBID (A) CLEAR   Specific Gravity, Urine 1.015 1.005 - 1.030   pH 8.5 (H) 5.0 - 8.0   Glucose, UA NEGATIVE NEGATIVE mg/dL   Hgb urine dipstick NEGATIVE NEGATIVE   Bilirubin Urine NEGATIVE NEGATIVE   Ketones, ur NEGATIVE NEGATIVE mg/dL   Protein, ur NEGATIVE NEGATIVE mg/dL   Nitrite NEGATIVE NEGATIVE   Leukocytes,Ua NEGATIVE NEGATIVE  Pregnancy, urine     Status: None   Collection Time: 07/31/20  9:06 PM  Result Value Ref Range   Preg Test, Ur NEGATIVE NEGATIVE  Urinalysis, Microscopic (reflex)     Status: Abnormal   Collection Time: 07/31/20  9:06 PM  Result Value Ref Range   RBC / HPF 0-5 0 - 5 RBC/hpf   WBC, UA 0-5 0 - 5 WBC/hpf   Bacteria, UA FEW (A) NONE SEEN   Squamous Epithelial / LPF 0-5 0 - 5   Amorphous Crystal PRESENT   Rapid urine drug screen (hospital performed)     Status: Abnormal   Collection Time: 07/31/20  9:06 PM  Result Value Ref Range   Opiates NONE DETECTED NONE DETECTED   Cocaine NONE DETECTED NONE DETECTED   Benzodiazepines NONE DETECTED NONE DETECTED   Amphetamines NONE DETECTED NONE  DETECTED   Tetrahydrocannabinol POSITIVE (A) NONE DETECTED   Barbiturates NONE DETECTED NONE DETECTED  Lipase, blood     Status: None   Collection Time: 07/31/20  9:09 PM  Result Value Ref Range   Lipase 26 11 - 51 U/L  Comprehensive metabolic panel     Status: Abnormal   Collection Time: 07/31/20  9:09 PM  Result Value Ref Range   Sodium 134 (L) 135 - 145 mmol/L   Potassium 3.2 (L) 3.5 - 5.1 mmol/L   Chloride 100 98 - 111 mmol/L   CO2 24 22 - 32 mmol/L   Glucose, Bld 109 (H) 70 - 99 mg/dL   BUN 5 (L) 6 - 20  mg/dL   Creatinine, Ser 9.38 0.44 - 1.00 mg/dL   Calcium 8.5 (L) 8.9 - 10.3 mg/dL   Total Protein 7.1 6.5 - 8.1 g/dL   Albumin 4.1 3.5 - 5.0 g/dL   AST 16 15 - 41 U/L   ALT 16 0 - 44 U/L   Alkaline Phosphatase 55 38 - 126 U/L   Total Bilirubin 0.5 0.3 - 1.2 mg/dL   GFR, Estimated >18 >29 mL/min   Anion gap 10 5 - 15  CBC     Status: Abnormal   Collection Time: 07/31/20  9:09 PM  Result Value Ref Range   WBC 12.7 (H) 4.0 - 10.5 K/uL   RBC 4.49 3.87 - 5.11 MIL/uL   Hemoglobin 14.1 12.0 - 15.0 g/dL   HCT 93.7 16.9 - 67.8 %   MCV 89.5 80.0 - 100.0 fL   MCH 31.4 26.0 - 34.0 pg   MCHC 35.1 30.0 - 36.0 g/dL   RDW 93.8 10.1 - 75.1 %   Platelets 341 150 - 400 K/uL   nRBC 0.0 0.0 - 0.2 %   Imaging Studies: DG Abdomen 1 View  Result Date: 08/01/2020 CLINICAL DATA:  30 year old female with constipation. EXAM: ABDOMEN - 1 VIEW COMPARISON:  Abdominal radiograph dated 02/24/2019. FINDINGS: No bowel dilatation or evidence of obstruction. No significant stool burden. No free air or radiopaque calculi. The osseous structures and soft tissues are grossly unremarkable. IMPRESSION: Negative. Electronically Signed   By: Elgie Collard M.D.   On: 08/01/2020 02:57    ED COURSE and MDM  Nursing notes, initial and subsequent vitals signs, including pulse oximetry, reviewed and interpreted by myself.  Vitals:   08/01/20 0043 08/01/20 0203 08/01/20 0300 08/01/20 0337  BP: (!) 146/95  131/71 138/88 140/82  Pulse: 77 68  73  Resp: 16 18 18 20   Temp: 98.6 F (37 C)     TempSrc: Oral     SpO2: 98% 98% 98% 98%  Weight:      Height:       Medications  potassium chloride SA (KLOR-CON) CR tablet 40 mEq (has no administration in time range)  ondansetron (ZOFRAN-ODT) disintegrating tablet 8 mg (8 mg Oral Given 08/01/20 0053)  haloperidol lactate (HALDOL) injection 5 mg (5 mg Intramuscular Given 08/01/20 0242)   3:48 AM Patient feeling better after IM haloperidol.  She is a regular user of cannabis and shows pan positive for cannabis on this visit.  She does not feel positive for opiates this visit.  I suspect her symptoms are due to cannabis associated hyperemesis syndrome, given her improvement with haloperidol.  Alternatively she could be withdrawing from narcotics but I would not expect this to improve with haloperidol. Although opioid abuse would explain her constipation and her radiograph does not show a significant stool burden.   PROCEDURES  Procedures   ED DIAGNOSES     ICD-10-CM   1. Cannabis hyperemesis syndrome concurrent with and due to cannabis abuse (HCC)  F12.188     2. Hypokalemia due to loss of potassium  E87.6          Abdinasir Spadafore, 08/03/20, MD 08/01/20 [phone]

## 2020-08-01 NOTE — ED Notes (Signed)
ED Provider at bedside. 

## 2020-08-18 ENCOUNTER — Other Ambulatory Visit: Payer: Self-pay

## 2020-08-18 ENCOUNTER — Emergency Department (HOSPITAL_COMMUNITY)
Admission: EM | Admit: 2020-08-18 | Discharge: 2020-08-19 | Disposition: A | Discharge status: Home / Self Care | Payer: Self-pay | Attending: Emergency Medicine | Admitting: Emergency Medicine

## 2020-08-18 DIAGNOSIS — N730 Acute parametritis and pelvic cellulitis: Secondary | ICD-10-CM

## 2020-08-18 DIAGNOSIS — N12 Tubulo-interstitial nephritis, not specified as acute or chronic: Secondary | ICD-10-CM | POA: Insufficient documentation

## 2020-08-18 DIAGNOSIS — N739 Female pelvic inflammatory disease, unspecified: Secondary | ICD-10-CM | POA: Insufficient documentation

## 2020-08-18 DIAGNOSIS — N9489 Other specified conditions associated with female genital organs and menstrual cycle: Secondary | ICD-10-CM | POA: Insufficient documentation

## 2020-08-18 DIAGNOSIS — F17210 Nicotine dependence, cigarettes, uncomplicated: Secondary | ICD-10-CM | POA: Insufficient documentation

## 2020-08-18 LAB — CBC
HCT: 39.1 % (ref 36.0–46.0)
Hemoglobin: 12.8 g/dL (ref 12.0–15.0)
MCH: 31.1 pg (ref 26.0–34.0)
MCHC: 32.7 g/dL (ref 30.0–36.0)
MCV: 95.1 fL (ref 80.0–100.0)
Platelets: 222 10*3/uL (ref 150–400)
RBC: 4.11 MIL/uL (ref 3.87–5.11)
RDW: 15 % (ref 11.5–15.5)
WBC: 17.1 10*3/uL — ABNORMAL HIGH (ref 4.0–10.5)
nRBC: 0 % (ref 0.0–0.2)

## 2020-08-18 LAB — URINALYSIS, ROUTINE W REFLEX MICROSCOPIC
Bacteria, UA: NONE SEEN
Bilirubin Urine: NEGATIVE
Glucose, UA: NEGATIVE mg/dL
Ketones, ur: NEGATIVE mg/dL
Nitrite: POSITIVE — AB
Protein, ur: 30 mg/dL — AB
Specific Gravity, Urine: 1.023 (ref 1.005–1.030)
pH: 5 (ref 5.0–8.0)

## 2020-08-18 LAB — I-STAT BETA HCG BLOOD, ED (MC, WL, AP ONLY): I-stat hCG, quantitative: 10.9 m[IU]/mL — ABNORMAL HIGH (ref <5)

## 2020-08-18 LAB — COMPREHENSIVE METABOLIC PANEL
ALT: 8 U/L (ref 0–44)
AST: 10 U/L — ABNORMAL LOW (ref 15–41)
Albumin: 2.9 g/dL — ABNORMAL LOW (ref 3.5–5.0)
Alkaline Phosphatase: 58 U/L (ref 38–126)
Anion gap: 10 (ref 5–15)
BUN: 12 mg/dL (ref 6–20)
CO2: 20 mmol/L — ABNORMAL LOW (ref 22–32)
Calcium: 8.2 mg/dL — ABNORMAL LOW (ref 8.9–10.3)
Chloride: 109 mmol/L (ref 98–111)
Creatinine, Ser: 0.96 mg/dL (ref 0.44–1.00)
GFR, Estimated: >60 mL/min (ref >60)
Glucose, Bld: 99 mg/dL (ref 70–99)
Potassium: 3.2 mmol/L — ABNORMAL LOW (ref 3.5–5.1)
Sodium: 139 mmol/L (ref 135–145)
Total Bilirubin: 0.3 mg/dL (ref 0.3–1.2)
Total Protein: 6.5 g/dL (ref 6.5–8.1)

## 2020-08-18 LAB — LIPASE, BLOOD: Lipase: 24 U/L (ref 11–51)

## 2020-08-18 NOTE — ED Triage Notes (Signed)
Pt presents with several day hx of right lower abdominal pain with vomiting.  Pt states this pain radiates around to her lower right back.  Increased frequency of urination but no burning. Pt has had no diarrhea.

## 2020-08-19 ENCOUNTER — Emergency Department (HOSPITAL_COMMUNITY): Payer: Self-pay

## 2020-08-19 ENCOUNTER — Encounter (HOSPITAL_COMMUNITY): Payer: Self-pay | Admitting: Student

## 2020-08-19 LAB — WET PREP, GENITAL
Clue Cells Wet Prep HPF POC: NONE SEEN
Sperm: NONE SEEN
Trich, Wet Prep: NONE SEEN
Yeast Wet Prep HPF POC: NONE SEEN

## 2020-08-19 LAB — HIV ANTIBODY (ROUTINE TESTING W REFLEX): HIV Screen 4th Generation wRfx: NONREACTIVE

## 2020-08-19 LAB — PREGNANCY, URINE: Preg Test, Ur: NEGATIVE

## 2020-08-19 LAB — HCG, QUANTITATIVE, PREGNANCY: hCG, Beta Chain, Quant, S: 1 m[IU]/mL (ref <5)

## 2020-08-19 MED ORDER — ONDANSETRON HCL 4 MG/2ML IJ SOLN
4.0000 mg | Freq: Once | INTRAMUSCULAR | Status: AC
  Administered 2020-08-19: 4 mg via INTRAVENOUS
  Filled 2020-08-19: qty 2

## 2020-08-19 MED ORDER — CEFDINIR 300 MG PO CAPS: 300.0000 mg | ORAL_CAPSULE | Freq: Two times a day (BID) | ORAL | 0 refills | Status: DC

## 2020-08-19 MED ORDER — METRONIDAZOLE 500 MG PO TABS: 500.0000 mg | ORAL_TABLET | Freq: Two times a day (BID) | ORAL | 0 refills | Status: DC

## 2020-08-19 MED ORDER — ONDANSETRON 4 MG PO TBDP: 4.0000 mg | ORAL_TABLET | Freq: Three times a day (TID) | ORAL | 0 refills | Status: DC | PRN

## 2020-08-19 MED ORDER — SODIUM CHLORIDE 0.9 % IV BOLUS
1000.0000 mL | Freq: Once | INTRAVENOUS | Status: AC
  Administered 2020-08-19: 1000 mL via INTRAVENOUS

## 2020-08-19 MED ORDER — DOXYCYCLINE HYCLATE 100 MG PO CAPS: 100.0000 mg | ORAL_CAPSULE | Freq: Two times a day (BID) | ORAL | 0 refills | Status: DC

## 2020-08-19 MED ORDER — SODIUM CHLORIDE 0.9 % IV SOLN
1.0000 g | Freq: Once | INTRAVENOUS | Status: AC
  Administered 2020-08-19: 1 g via INTRAVENOUS
  Filled 2020-08-19: qty 10

## 2020-08-19 MED ORDER — MORPHINE SULFATE (PF) 4 MG/ML IV SOLN
4.0000 mg | Freq: Once | INTRAVENOUS | Status: AC
  Administered 2020-08-19: 4 mg via INTRAVENOUS
  Filled 2020-08-19: qty 1

## 2020-08-19 MED ORDER — NAPROXEN 500 MG PO TABS: 500.0000 mg | ORAL_TABLET | Freq: Two times a day (BID) | ORAL | 0 refills | Status: DC | PRN

## 2020-08-19 NOTE — ED Provider Notes (Signed)
[facility] EMERGENCY DEPARTMENT Provider Note   CSN: [id number] Arrival date & time: 08/18/20  2014     History Chief Complaint  Patient presents with   Abdominal Pain    Kelli Hoover is a 30 y.o. female with a hx of appendectomy, migraines, and tobacco use who presents to the ED with complaints of abdominal pain for the past 1 week.  Patient states the pain is in her right flank and radiates into the right side of her abdomen, it is a constant aching pain with intermittent increases in discomfort, seemed much worse over the last 24 hours prompting emergency department visit.  She has had associated nausea, vomiting, sweats, chills, frequency, and urgency.  She also notes that over the past 4 to 5 days she started having a vaginal discharge that is different for her after having intercourse with her significant other whom she just got back together with.  She does not know if she was exposed to any specific STD.  She was recently treated for a UTI with Keflex 3 times daily for 1 week with resolution of her urinary symptoms, however developed above symptoms approximately 1 to 2 weeks following completion of antibiotics she denies hematemesis, diarrhea, melena, hematochezia, or dysuria, or vaginal bleeding.  HPI     Past Medical History:  Diagnosis Date   Migraine    Miscarriage    Panic attack     Patient Active Problem List   Diagnosis Date Noted   Right upper quadrant pain 02/28/2019   Gallbladder mass 02/28/2019   Constipation 02/28/2019   Nausea with vomiting 02/28/2019   Acute appendicitis    GAD (generalized anxiety disorder) 07/20/2014    Past Surgical History:  Procedure Laterality Date   APPENDECTOMY     DILATION AND CURETTAGE OF UTERUS     INDUCED ABORTION     LAPAROSCOPIC APPENDECTOMY N/A 07/13/2018   Procedure: LAPAROSCOPIC APPENDECTOMY;  Surgeon: Franky Macho, MD;  Location: [facility];  Service: General;  Laterality: N/A;     OB History      Gravida  2   Para      Term      Preterm      AB      Living         SAB      IAB      Ectopic      Multiple      Live Births              Family History  Problem Relation Age of Onset   Hypertension Mother    Anxiety disorder Mother    Cancer Other    Hypertension Other    Anxiety disorder Sister    Anxiety disorder Maternal Grandmother    Colon cancer Maternal Grandmother        age greater than 17   Ulcers Paternal Aunt     Social History   Tobacco Use   Smoking status: Every Day    Packs/day: 1.00    Years: 7.00    Pack years: 7.00    Types: Cigarettes   Smokeless tobacco: Never  Vaping Use   Vaping Use: Never used  Substance Use Topics   Alcohol use: Not Currently   Drug use: Yes    Types: Marijuana    Home Medications Prior to Admission medications   Medication Sig Start Date End Date Taking? Authorizing Provider  haloperidol (HALDOL) 5 MG tablet Take 1 tablet twice daily  as needed for cannabis hyperemesis syndrome. 08/01/20   Molpus, Jonny Ruiz, MD  ondansetron (ZOFRAN ODT) 4 MG disintegrating tablet Take 1 tablet (4 mg total) by mouth every 8 (eight) hours as needed for nausea or vomiting. 07/19/20   Farrel Gordon, PA-C  pantoprazole (PROTONIX) 40 MG tablet Take 1 tablet every morning at least 30 minutes before first dose of Carafate. 07/21/20   Molpus, John, MD  sucralfate (CARAFATE) 1 g tablet Take 1 tablet (1 g total) by mouth 4 (four) times daily -  with meals and at bedtime. 07/21/20   Molpus, Jonny Ruiz, MD    Allergies    Erythromycin  Review of Systems   Review of Systems  Constitutional:  Positive for chills. Negative for fever.  Respiratory:  Negative for cough and shortness of breath.   Cardiovascular:  Negative for chest pain.  Gastrointestinal:  Positive for abdominal pain, nausea and vomiting. Negative for blood in stool, constipation and diarrhea.  Genitourinary:  Positive for flank pain, frequency, urgency and vaginal discharge.  Negative for dysuria and vaginal bleeding.  Neurological:  Negative for syncope.  All other systems reviewed and are negative.  Physical Exam Updated Vital Signs BP 115/71   Pulse 87   Temp 100.1 F (37.8 C) (Oral)   Resp 16   Ht 5' (1.524 m)   Wt 52.6 kg   LMP 07/24/2020 Comment: Neg preg today  SpO2 98%   BMI 22.65 kg/m   Physical Exam Vitals and nursing note reviewed.  Constitutional:      General: She is not in acute distress.    Appearance: She is well-developed. She is not toxic-appearing.  HENT:     Head: Normocephalic and atraumatic.  Eyes:     General:        Right eye: No discharge.        Left eye: No discharge.     Conjunctiva/sclera: Conjunctivae normal.  Cardiovascular:     Rate and Rhythm: Normal rate and regular rhythm.  Pulmonary:     Effort: Pulmonary effort is normal. No respiratory distress.     Breath sounds: Normal breath sounds. No wheezing, rhonchi or rales.  Abdominal:     General: There is no distension.     Palpations: Abdomen is soft.     Tenderness: There is abdominal tenderness in the right upper quadrant, right lower quadrant and suprapubic area. There is right CVA tenderness. Negative signs include Murphy's sign.  Genitourinary:    Comments: NT present as chaperone.  White vaginal discharge present.  No bleeding.  Diffuse tenderness throughout bimanual exam. Musculoskeletal:     Cervical back: Neck supple.  Skin:    General: Skin is warm and dry.     Findings: No rash.  Neurological:     Mental Status: She is alert.     Comments: Clear speech.   Psychiatric:        Behavior: Behavior normal.    ED Results / Procedures / Treatments   Labs (all labs ordered are listed, but only abnormal results are displayed) Labs Reviewed  COMPREHENSIVE METABOLIC PANEL - Abnormal; Notable for the following components:      Result Value   Potassium 3.2 (*)    CO2 20 (*)    Calcium 8.2 (*)    Albumin 2.9 (*)    AST 10 (*)    All other  components within normal limits  CBC - Abnormal; Notable for the following components:   WBC 17.1 (*)    All  other components within normal limits  URINALYSIS, ROUTINE W REFLEX MICROSCOPIC - Abnormal; Notable for the following components:   APPearance HAZY (*)    Hgb urine dipstick SMALL (*)    Protein, ur 30 (*)    Nitrite POSITIVE (*)    Leukocytes,Ua MODERATE (*)    All other components within normal limits  I-STAT BETA HCG BLOOD, ED ([facility], [facility], [facility] ONLY) - Abnormal; Notable for the following components:   I-stat hCG, quantitative 10.9 (*)    All other components within normal limits  LIPASE, BLOOD  PREGNANCY, URINE    EKG None  Radiology CT Renal Stone Study  Result Date: 08/19/2020 CLINICAL DATA:  Right lower abdominal pain EXAM: CT ABDOMEN AND PELVIS WITHOUT CONTRAST TECHNIQUE: Multidetector CT imaging of the abdomen and pelvis was performed following the standard protocol without IV contrast. COMPARISON:  07/19/2020 FINDINGS: Lower chest: Lung bases are clear. No effusions. Heart is normal size. Hepatobiliary: No focal hepatic abnormality. Gallbladder unremarkable. Pancreas: No focal abnormality or ductal dilatation. Spleen: No focal abnormality.  Normal size. Adrenals/Urinary Tract: No adrenal abnormality. No focal renal abnormality. No stones or hydronephrosis. Urinary bladder is unremarkable. Stomach/Bowel: Prior appendectomy. Stomach, large and small bowel grossly unremarkable. Vascular/Lymphatic: No evidence of aneurysm or adenopathy. Reproductive: Uterus and adnexa unremarkable.  No mass. Other: Trace free fluid in the cul-de-sac.  No free air. Musculoskeletal: No acute bony abnormality. IMPRESSION: No renal or ureteral stones.  No hydronephrosis. No acute findings in the abdomen or pelvis. Electronically Signed   By: Charlett NoseKevin  Dover M.D.   On: 08/19/2020 12:34    Procedures Procedures   Medications Ordered in ED Medications - No data to display  ED Course  I have reviewed the  triage vital signs and the nursing notes.  Pertinent labs & imaging results that were available during my care of the patient were reviewed by me and considered in my medical decision making (see chart for details).    MDM Rules/Calculators/A&P                           Patient presents to the ED with complaints of right sided flank/abdominal pain. Patient is nontoxic, vitals without significant abnormality- oral temp 100.1 noted. RUQ/RLQ, suprapubic & R CVA tenderness noted on exam.   Additional history obtained:  Additional history obtained from chart review & nursing note review.   Lab Tests:  I reviewed and interpreted labs, which included:  CBC: Leukocytosis of 17,000. CMP: Mild hypokalemia, discussed diet recommendations Lipase: Within normal limits I-STAT beta-hCG is mildly elevated, subsequently had a negative urine pregnancy test and a negative quantitative hCG. Urinalysis: Concerning for infection, sent for culture  Imaging Studies ordered:  I ordered imaging studies which included CT renal stone study, I independently reviewed, formal radiology impression shows:  No renal or ureteral stones.  No hydronephrosis. No acute findings in the abdomen or pelvis.  ED Course:  Patient presentation clinically consistent with pyelonephritis-I suspect this is the primary underlying etiology to patient's presentation, she however does have a new recent sexual partner with new vaginal discharge and diffuse tenderness on bimanual exam and therefore also considering pelvic inflammatory disease.  I discussed with RPH Alferd ApaJonathan Worley-we will give 1 g of Rocephin in the emergency department and discharged home on cefdinir, Flagyl, and doxycycline for pyelonephritis and PID coverage.  We will additionally provide Zofran and naproxen for further symptomatic care.  Patient is tolerating p.o., her pain is significantly  improved, and she is requesting to be discharged.  We discussed her pending STI  testing, need for abstinence during and for a week following antibiotic regimen, & need to inform all sexual partners if positive. PCP/obgyn follow up with strict return precautions.  Coupons were provided for all prescriptions.  I discussed results, treatment plan, need for follow-up, and return precautions with the patient. Provided opportunity for questions, patient confirmed understanding and is in agreement with plan.    Findings and plan of care discussed with supervising physician Dr. Dalene Seltzer who is in agreement.   Portions of this note were generated with Scientist, clinical (histocompatibility and immunogenetics). Dictation errors may occur despite best attempts at proofreading.  Final Clinical Impression(s) / ED Diagnoses Final diagnoses:  Pyelonephritis  PID (acute pelvic inflammatory disease)    Rx / DC Orders ED Discharge Orders          Ordered    cefdinir (OMNICEF) 300 MG capsule  2 times daily        08/19/20 1252    metroNIDAZOLE (FLAGYL) 500 MG tablet  2 times daily        08/19/20 1252    doxycycline (VIBRAMYCIN) 100 MG capsule  2 times daily        08/19/20 1252    ondansetron (ZOFRAN ODT) 4 MG disintegrating tablet  Every 8 hours PRN        08/19/20 1252    naproxen (NAPROSYN) 500 MG tablet  2 times daily PRN        08/19/20 1252             Abeeha Twist, Ladue R, PA-C 08/19/20 1303    Alvira Monday, MD 08/19/20 2354

## 2020-08-19 NOTE — Discharge Instructions (Addendum)
You are seen in the emergency department today for abdominal pain and back pain.  Your blood work is concerning for pyelonephritis, this is a kidney infection.  Given your vaginal discharge and abdominal pain we also concern that you may have pelvic inflammatory disease.  We are sending you home with multiple antibiotics to treat these conditions including cefdinir (to treat the kidney infection) as well as doxycycline and Flagyl (to treat the pelvic inflammatory disease).  Do not drink alcohol with Flagyl as it can be extremely dangerous.  We are also sending home with Zofran to take every hours as needed for nausea and vomiting and naproxen to take every 12 hours as needed for pain.  Take naproxen with food as it can cause stomach upset and at worst stomach bleeding.  Do not take other NSAIDs with this such as ibuprofen, Advil, Aleve, Goody powder, Mobic, etc. these are similar.  Please take all of your antibiotics until finished. You may develop abdominal discomfort or diarrhea from the antibiotic.  You may help offset this with probiotics which you can buy at the store (ask your pharmacist if unable to find) or get probiotics in the form of eating yogurt. Do not eat or take the probiotics until 2 hours after your antibiotic. If you are unable to tolerate these side effects follow-up with your primary care provider or return to the emergency department.   If you begin to experience any blistering, rashes, swelling, or difficulty breathing seek medical care for evaluation of potentially more serious side effects.   Please be aware that this medication may interact with other medications you are taking, please be sure to discuss your medication list with your pharmacist. If you are taking birth control the antibiotic will deactivate your birth control for 2 weeks.   Please follow-up with your primary care provider as well as OB/GYN within 3 days.  Return to the emergency department for new or worsening  symptoms including but not limited to new or worsening pain, inability to keep fluids down, passing out, fever, or any other concerns.  We have tested you for gonorrhea, chlamydia, HIV, and syphilis, we will call you if these results are positive.  If positive you will need to inform your sexual partners.  Do not have intercourse of any kind until at least 1 week following your last dose of antibiotics.

## 2020-08-20 LAB — RPR
RPR Ser Ql: REACTIVE — AB
RPR Titer: 1:1 {titer}

## 2020-08-20 LAB — GC/CHLAMYDIA PROBE AMP (~~LOC~~) NOT AT ARMC
Chlamydia: NEGATIVE
Comment: NEGATIVE
Comment: NORMAL
Neisseria Gonorrhea: NEGATIVE

## 2020-08-21 LAB — URINE CULTURE: Culture: >=100000 — AB

## 2020-08-21 LAB — T.PALLIDUM AB, TOTAL: T Pallidum Abs: NONREACTIVE

## 2020-08-22 ENCOUNTER — Telehealth: Payer: Self-pay | Admitting: *Deleted

## 2020-08-22 NOTE — Telephone Encounter (Signed)
Post ED Visit - Positive Culture Follow-up  Culture report reviewed by antimicrobial stewardship pharmacist: [facility] []  , Pharm.D. []  Enzo Bi, Pharm.D., BCPS AQ-ID []  , Pharm.D., BCPS []  Celedonio Miyamoto, .D., BCPS []  [place], .D., BCPS, AAHIVP []  Georgina Pillion, Pharm.D., BCPS, AAHIVP []  [address], PharmD, BCPS []  , PharmD, BCPS []  [place], PharmD, BCPS []  [address], PharmD []  , PharmD, BCPS []  Estella Husk, PharmD  Pharmacy Team []  Lysle Pearl, PharmD []  , PharmD []  Phillips Climes, PharmD []  , Rph []  Agapito Games) , PharmD []  Verlan Friends, PharmD []  , PharmD []  Mervyn Gay, PharmD []  , PharmD []  Vinnie Level, PharmD []  [facility], PharmD []  , PharmD []  Len Childs, PharmD   Positive URINE culture Treated with CEFDINIR, organism sensitive to the same and no further patient follow-up is required at this time.  , PHARMD  Greer Pickerel 08/22/2020, 9:52 AM

## 2020-09-27 ENCOUNTER — Emergency Department (HOSPITAL_BASED_OUTPATIENT_CLINIC_OR_DEPARTMENT_OTHER)
Admission: EM | Admit: 2020-09-27 | Discharge: 2020-09-27 | Disposition: A | Discharge status: Home / Self Care | Payer: Self-pay | Attending: Emergency Medicine | Admitting: Emergency Medicine

## 2020-09-27 ENCOUNTER — Encounter (HOSPITAL_BASED_OUTPATIENT_CLINIC_OR_DEPARTMENT_OTHER): Payer: Self-pay

## 2020-09-27 ENCOUNTER — Other Ambulatory Visit: Payer: Self-pay

## 2020-09-27 DIAGNOSIS — R112 Nausea with vomiting, unspecified: Secondary | ICD-10-CM

## 2020-09-27 DIAGNOSIS — F17210 Nicotine dependence, cigarettes, uncomplicated: Secondary | ICD-10-CM | POA: Insufficient documentation

## 2020-09-27 DIAGNOSIS — E876 Hypokalemia: Secondary | ICD-10-CM | POA: Insufficient documentation

## 2020-09-27 DIAGNOSIS — U071 COVID-19: Secondary | ICD-10-CM | POA: Insufficient documentation

## 2020-09-27 LAB — CBC WITH DIFFERENTIAL/PLATELET
Abs Immature Granulocytes: 0.03 10*3/uL (ref 0.00–0.07)
Basophils Absolute: 0 10*3/uL (ref 0.0–0.1)
Basophils Relative: 0 %
Eosinophils Absolute: 0 10*3/uL (ref 0.0–0.5)
Eosinophils Relative: 0 %
HCT: 38 % (ref 36.0–46.0)
Hemoglobin: 13.4 g/dL (ref 12.0–15.0)
Immature Granulocytes: 0 %
Lymphocytes Relative: 18 %
Lymphs Abs: 1.4 10*3/uL (ref 0.7–4.0)
MCH: 31.5 pg (ref 26.0–34.0)
MCHC: 35.3 g/dL (ref 30.0–36.0)
MCV: 89.2 fL (ref 80.0–100.0)
Monocytes Absolute: 0.7 10*3/uL (ref 0.1–1.0)
Monocytes Relative: 9 %
Neutro Abs: 5.9 10*3/uL (ref 1.7–7.7)
Neutrophils Relative %: 73 %
Platelets: 132 10*3/uL — ABNORMAL LOW (ref 150–400)
RBC: 4.26 MIL/uL (ref 3.87–5.11)
RDW: 14.1 % (ref 11.5–15.5)
WBC: 8.1 10*3/uL (ref 4.0–10.5)
nRBC: 0 % (ref 0.0–0.2)

## 2020-09-27 LAB — URINALYSIS, ROUTINE W REFLEX MICROSCOPIC
Bilirubin Urine: NEGATIVE
Glucose, UA: NEGATIVE mg/dL
Hgb urine dipstick: NEGATIVE
Ketones, ur: NEGATIVE mg/dL
Leukocytes,Ua: NEGATIVE
Nitrite: NEGATIVE
Protein, ur: NEGATIVE mg/dL
Specific Gravity, Urine: 1.01 (ref 1.005–1.030)
pH: 6.5 (ref 5.0–8.0)

## 2020-09-27 LAB — COMPREHENSIVE METABOLIC PANEL
ALT: 14 U/L (ref 0–44)
AST: 18 U/L (ref 15–41)
Albumin: 3.7 g/dL (ref 3.5–5.0)
Alkaline Phosphatase: 50 U/L (ref 38–126)
Anion gap: 8 (ref 5–15)
BUN: 9 mg/dL (ref 6–20)
CO2: 24 mmol/L (ref 22–32)
Calcium: 8.6 mg/dL — ABNORMAL LOW (ref 8.9–10.3)
Chloride: 102 mmol/L (ref 98–111)
Creatinine, Ser: 0.59 mg/dL (ref 0.44–1.00)
GFR, Estimated: >60 mL/min (ref >60)
Glucose, Bld: 83 mg/dL (ref 70–99)
Potassium: 3.2 mmol/L — ABNORMAL LOW (ref 3.5–5.1)
Sodium: 134 mmol/L — ABNORMAL LOW (ref 135–145)
Total Bilirubin: 0.3 mg/dL (ref 0.3–1.2)
Total Protein: 6.6 g/dL (ref 6.5–8.1)

## 2020-09-27 LAB — TROPONIN I (HIGH SENSITIVITY): Troponin I (High Sensitivity): <2 ng/L (ref <18)

## 2020-09-27 LAB — PREGNANCY, URINE: Preg Test, Ur: NEGATIVE

## 2020-09-27 LAB — LIPASE, BLOOD: Lipase: 20 U/L (ref 11–51)

## 2020-09-27 MED ORDER — SODIUM CHLORIDE 0.9 % IV BOLUS
1000.0000 mL | Freq: Once | INTRAVENOUS | Status: AC
  Administered 2020-09-27: 1000 mL via INTRAVENOUS

## 2020-09-27 MED ORDER — IBUPROFEN 800 MG PO TABS
800.0000 mg | ORAL_TABLET | Freq: Once | ORAL | Status: AC
  Administered 2020-09-27: 800 mg via ORAL
  Filled 2020-09-27: qty 1

## 2020-09-27 MED ORDER — ONDANSETRON HCL 4 MG/2ML IJ SOLN
4.0000 mg | Freq: Once | INTRAMUSCULAR | Status: AC
  Administered 2020-09-27: 4 mg via INTRAVENOUS
  Filled 2020-09-27: qty 2

## 2020-09-27 MED ORDER — POTASSIUM CHLORIDE CRYS ER 20 MEQ PO TBCR
40.0000 meq | EXTENDED_RELEASE_TABLET | Freq: Once | ORAL | Status: AC
  Administered 2020-09-27: 40 meq via ORAL
  Filled 2020-09-27: qty 2

## 2020-09-27 MED ORDER — ONDANSETRON 4 MG PO TBDP: 4.0000 mg | ORAL_TABLET | Freq: Three times a day (TID) | ORAL | 0 refills | Status: DC | PRN

## 2020-09-27 NOTE — Discharge Instructions (Signed)
Home to rest.  Take Zofran as needed as prescribed for nausea and vomiting.  Recommend hydrating fluids such as Gatorade (low sugar).  Take Motrin Tylenol as needed directed for fevers and body aches. Recheck with your doctor symptoms or not improving.  Return to the emergency room for severe concerning symptoms.  Your work-up today is overall reassuring.  Your potassium was a little low, you are given a one-time potassium supplement dose.

## 2020-09-27 NOTE — ED Provider Notes (Signed)
[facility] EMERGENCY DEPARTMENT Provider Note   CSN: [id number] Arrival date & time: 09/27/20  1017     History Chief Complaint  Patient presents with   Vomiting    COVID +    Kelli Hoover is a 30 y.o. female.  30 year old female presents with feeling unwell with known rapid COVID home test.  Patient states her symptoms started with nasal congestion 1 week ago, had a positive at-home COVID test the following day.  Has progressed to develop significant diffuse body aches with vomiting, fevers, chills, sweats and pain in her chest.  Patient has been trying to treat at home with NyQuil and DayQuil with limited relief.  Does not have any antiemetics at home, last took Tylenol yesterday, has not had ibuprofen.  Prior abdominal surgery includes laparoscopic appendectomy.  Denies significant past medical history.  No other complaints or concerns.      Past Medical History:  Diagnosis Date   Migraine    Miscarriage    Panic attack     Patient Active Problem List   Diagnosis Date Noted   Right upper quadrant pain 02/28/2019   Gallbladder mass 02/28/2019   Constipation 02/28/2019   Nausea with vomiting 02/28/2019   Acute appendicitis    GAD (generalized anxiety disorder) 07/20/2014    Past Surgical History:  Procedure Laterality Date   APPENDECTOMY     DILATION AND CURETTAGE OF UTERUS     INDUCED ABORTION     LAPAROSCOPIC APPENDECTOMY N/A 07/13/2018   Procedure: LAPAROSCOPIC APPENDECTOMY;  Surgeon: Franky Macho, MD;  Location: [facility];  Service: General;  Laterality: N/A;     OB History     Gravida  2   Para      Term      Preterm      AB      Living         SAB      IAB      Ectopic      Multiple      Live Births              Family History  Problem Relation Age of Onset   Hypertension Mother    Anxiety disorder Mother    Cancer Other    Hypertension Other    Anxiety disorder Sister    Anxiety disorder Maternal Grandmother     Colon cancer Maternal Grandmother        age greater than 31   Ulcers Paternal Aunt     Social History   Tobacco Use   Smoking status: Every Day    Packs/day: 1.00    Years: 7.00    Pack years: 7.00    Types: Cigarettes   Smokeless tobacco: Never  Vaping Use   Vaping Use: Never used  Substance Use Topics   Alcohol use: Not Currently   Drug use: Yes    Types: Marijuana    Home Medications Prior to Admission medications   Medication Sig Start Date End Date Taking? Authorizing Provider  ondansetron (ZOFRAN ODT) 4 MG disintegrating tablet Take 1 tablet (4 mg total) by mouth every 8 (eight) hours as needed for nausea or vomiting. 09/27/20  Yes Jeannie Fend, PA-C  cefdinir (OMNICEF) 300 MG capsule Take 1 capsule (300 mg total) by mouth 2 (two) times daily. 08/19/20   Petrucelli, Samantha R, PA-C  doxycycline (VIBRAMYCIN) 100 MG capsule Take 1 capsule (100 mg total) by mouth 2 (two) times daily. 08/19/20  Petrucelli, Samantha R, PA-C  haloperidol (HALDOL) 5 MG tablet Take 1 tablet twice daily as needed for cannabis hyperemesis syndrome. 08/01/20   Molpus, John, MD  metroNIDAZOLE (FLAGYL) 500 MG tablet Take 1 tablet (500 mg total) by mouth 2 (two) times daily. 08/19/20   Petrucelli, Samantha R, PA-C  naproxen (NAPROSYN) 500 MG tablet Take 1 tablet (500 mg total) by mouth 2 (two) times daily as needed for moderate pain. 08/19/20   Petrucelli, Samantha R, PA-C  pantoprazole (PROTONIX) 40 MG tablet Take 1 tablet every morning at least 30 minutes before first dose of Carafate. 07/21/20   Molpus, John, MD  sucralfate (CARAFATE) 1 g tablet Take 1 tablet (1 g total) by mouth 4 (four) times daily -  with meals and at bedtime. 07/21/20   Molpus, John, MD    Allergies    Erythromycin  Review of Systems   Review of Systems  Constitutional:  Positive for chills, diaphoresis and fever.  HENT:  Positive for congestion.   Respiratory:  Positive for cough.   Cardiovascular:  Positive for chest pain.   Gastrointestinal:  Positive for nausea and vomiting. Negative for abdominal pain and diarrhea.  Genitourinary:  Negative for dysuria.  Musculoskeletal:  Positive for arthralgias and myalgias.  Skin:  Negative for rash and wound.  Allergic/Immunologic: Negative for immunocompromised state.  Neurological:  Negative for weakness.  Hematological:  Negative for adenopathy.  Psychiatric/Behavioral:  Negative for confusion.   All other systems reviewed and are negative.  Physical Exam Updated Vital Signs BP 109/67   Pulse 79   Temp 100.3 F (37.9 C) (Oral)   Resp 12   Ht 5' (1.524 m)   Wt 50.7 kg   LMP 09/24/2020 (Exact Date)   SpO2 100%   BMI 21.83 kg/m   Physical Exam Vitals and nursing note reviewed.  Constitutional:      General: She is not in acute distress.    Appearance: She is well-developed. She is not diaphoretic.     Comments: Appears to feel uncomfortable.  HENT:     Head: Normocephalic and atraumatic.  Eyes:     Conjunctiva/sclera: Conjunctivae normal.  Cardiovascular:     Rate and Rhythm: Normal rate and regular rhythm.     Pulses: Normal pulses.     Heart sounds: Normal heart sounds.    No friction rub.  Pulmonary:     Effort: Pulmonary effort is normal.     Breath sounds: Normal breath sounds.  Abdominal:     Palpations: Abdomen is soft.     Tenderness: There is no abdominal tenderness.  Musculoskeletal:     Cervical back: Neck supple.     Right lower leg: No edema.     Left lower leg: No edema.  Skin:    General: Skin is warm and dry.     Findings: No erythema or rash.  Neurological:     Mental Status: She is alert and oriented to person, place, and time.  Psychiatric:        Behavior: Behavior normal.    ED Results / Procedures / Treatments   Labs (all labs ordered are listed, but only abnormal results are displayed) Labs Reviewed  CBC WITH DIFFERENTIAL/PLATELET - Abnormal; Notable for the following components:      Result Value   Platelets  132 (*)    All other components within normal limits  COMPREHENSIVE METABOLIC PANEL - Abnormal; Notable for the following components:   Sodium 134 (*)    Potassium  3.2 (*)    Calcium 8.6 (*)    All other components within normal limits  URINALYSIS, ROUTINE W REFLEX MICROSCOPIC - Abnormal; Notable for the following components:   APPearance HAZY (*)    All other components within normal limits  LIPASE, BLOOD  PREGNANCY, URINE  TROPONIN I (HIGH SENSITIVITY)    EKG None  Radiology No results found.  Procedures Procedures   Medications Ordered in ED Medications  ondansetron (ZOFRAN) injection 4 mg (4 mg Intravenous Given 09/27/20 1134)  sodium chloride 0.9 % bolus 1,000 mL (1,000 mLs Intravenous New Bag/Given 09/27/20 1128)  ibuprofen (ADVIL) tablet 800 mg (800 mg Oral Given 09/27/20 1134)  potassium chloride SA (KLOR-CON) CR tablet 40 mEq (40 mEq Oral Given 09/27/20 1234)    ED Course  I have reviewed the triage vital signs and the nursing notes.  Pertinent labs & imaging results that were available during my care of the patient were reviewed by me and considered in my medical decision making (see chart for details).  Clinical Course as of 09/27/20 1246  Thu Sep 27, 2020  6432 30 year old female with positive COVID test at home presents with complaint of vomiting and feeling unwell.  On exam continues to feel unwell without specific COVID findings.  CBC within normal limits, CMP with mild hypokalemia, given oral replacement, tolerating p.o. fluids.  Pregnancy test is negative, urinalysis unremarkable, lipase normal is.  Troponin obtained due to complaint of chest pain and is unremarkable.  EKG without ischemic changes. Patient was borderline febrile with temperature 100.3 on arrival, and was given ibuprofen.  Heart rate improved.  Given IV fluids.  O2 sat 100% on room air. Recommend home to rest and hydrate.  Given prescription for Zofran as needed as prescribed.  Return to ED for  worsening or concerning symptoms otherwise follow-up with PCP. [LM]    Clinical Course User Index [LM] Alden Hipp   MDM Rules/Calculators/A&P                            Final Clinical Impression(s) / ED Diagnoses Final diagnoses:  COVID  Hypokalemia  Non-intractable vomiting with nausea, unspecified vomiting type    Rx / DC Orders ED Discharge Orders          Ordered    ondansetron (ZOFRAN ODT) 4 MG disintegrating tablet  Every 8 hours PRN        09/27/20 1244             Jeannie Fend, PA-C 09/27/20 1246    Koleen Distance, MD 09/27/20 1515

## 2020-09-27 NOTE — ED Triage Notes (Signed)
Pt tested positive for COVID on Saturday. States it has worsened. Having fevers, vomiting, chills and bodyaches. States feels dehydrated.

## 2021-12-09 IMAGING — CT CT ABD-PELV W/ CM
2 of 4 series · 15 of 46 positions shown, 17 images · IV contrast (omnipaque)
Comparison: CT 02/22/2019

CLINICAL DATA: Lower abdominal pain, nausea and vomiting for 3 days

EXAM:
CT ABDOMEN AND PELVIS WITH CONTRAST
TECHNIQUE: Multidetector CT imaging of the abdomen and pelvis was performed
using the standard protocol following bolus administration of
intravenous contrast.
CONTRAST:  100mL OMNIPAQUE IOHEXOL 300 MG/ML  SOLN

[Series 2: axial st · axial · 0.81mm/px · z∈[+645,+1050]mm · 12 of 89 slices shown, 14 images]
[im 4/89  soft-tissue]
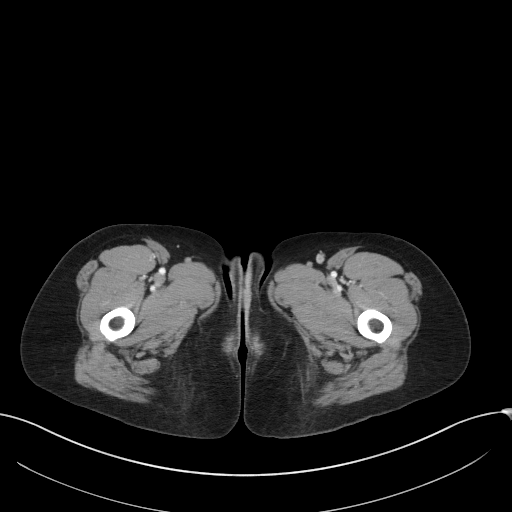
[im 4/89  bone]
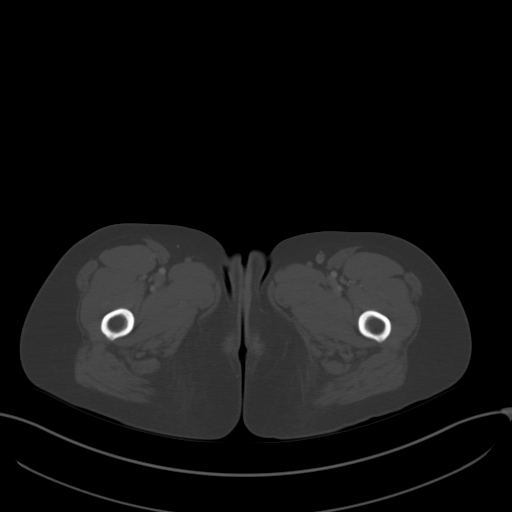
[im 12/89  soft-tissue]
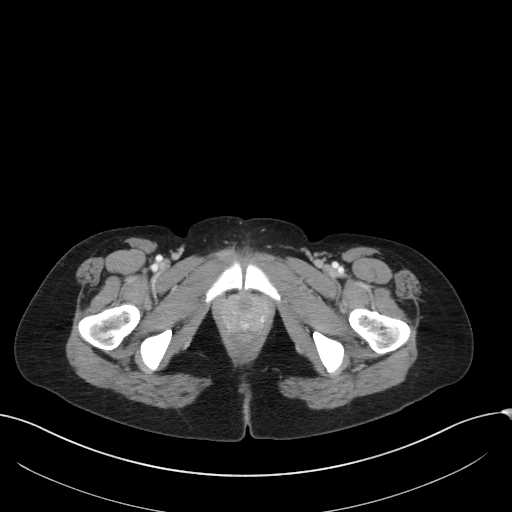
[im 19/89  soft-tissue]
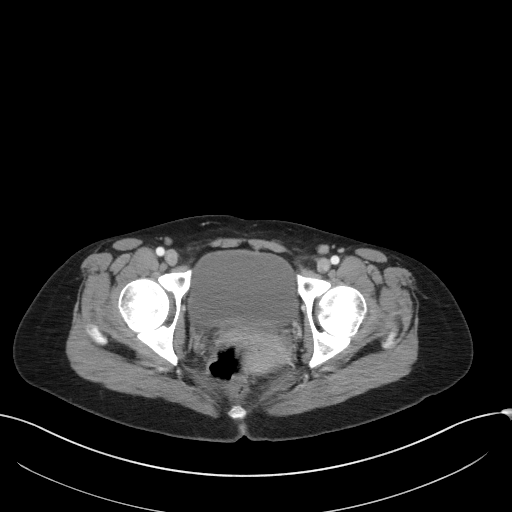
[im 26/89  soft-tissue]
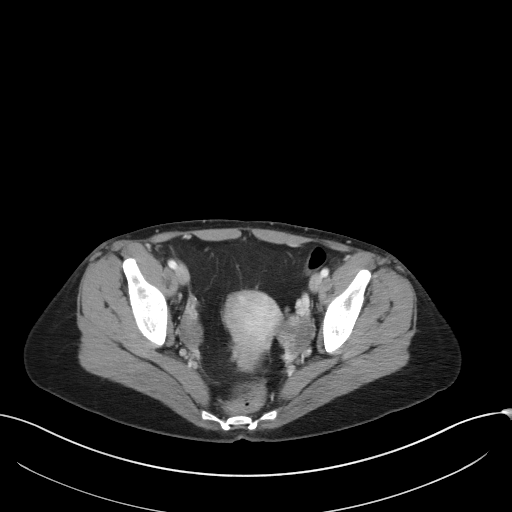
[im 34/89  soft-tissue]
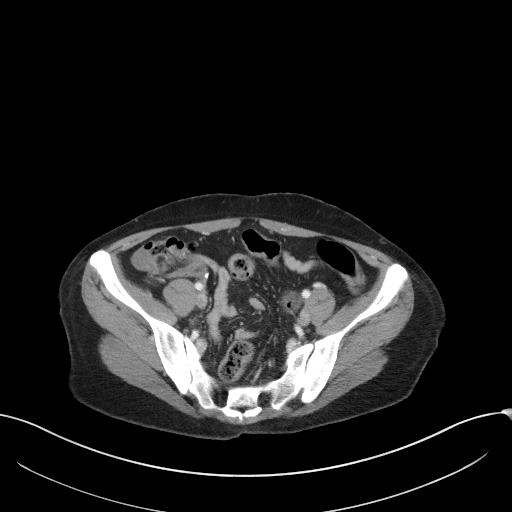
[im 41/89  soft-tissue]
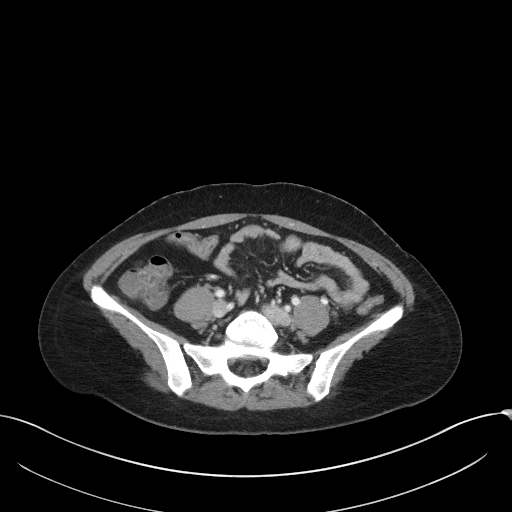
[im 48/89  soft-tissue]
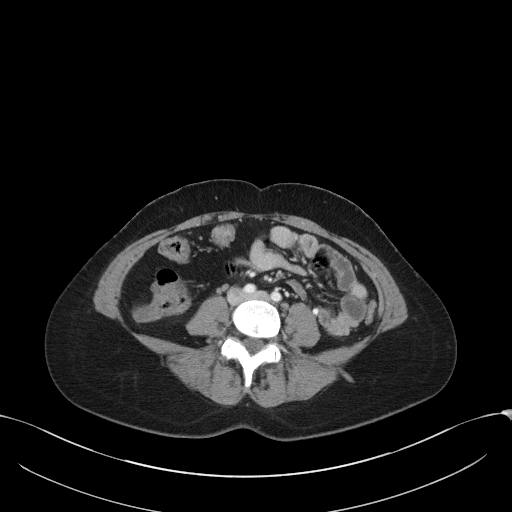
[im 56/89  soft-tissue]
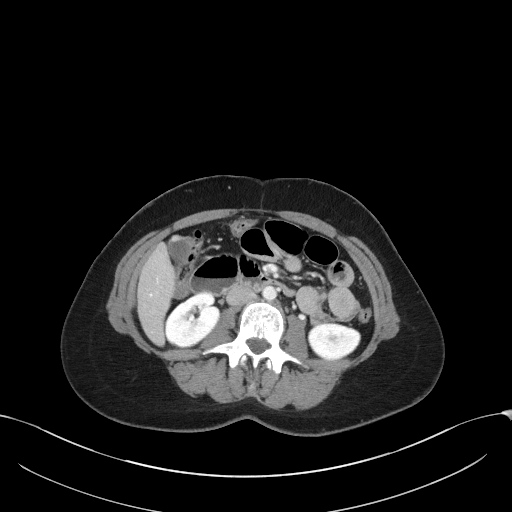
[im 63/89  soft-tissue]
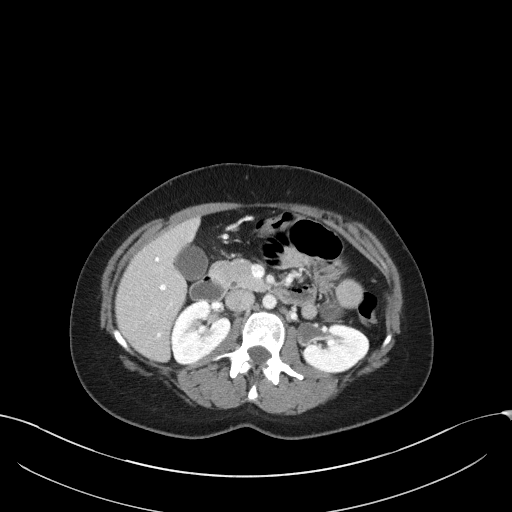
[im 63/89  bone]
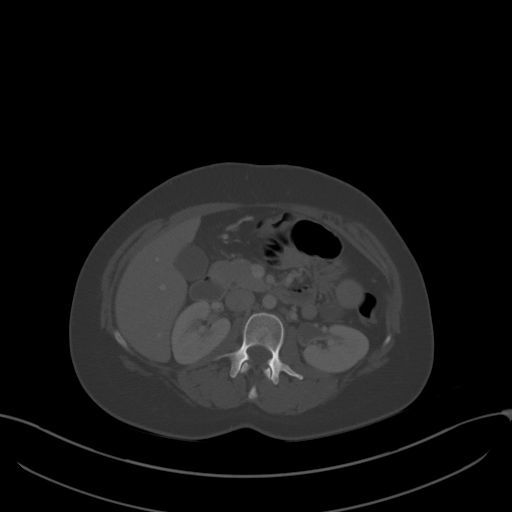
[im 70/89  soft-tissue]
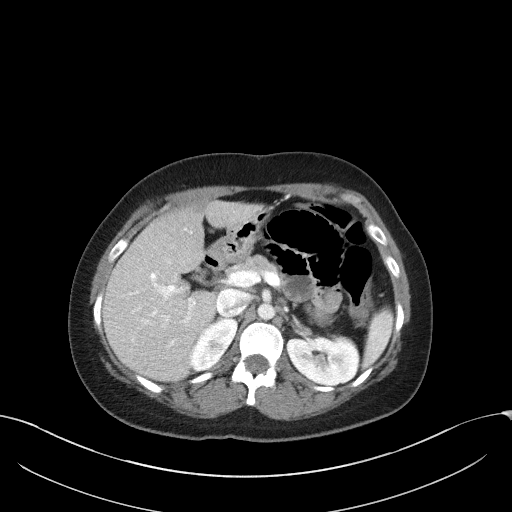
[im 78/89  soft-tissue]
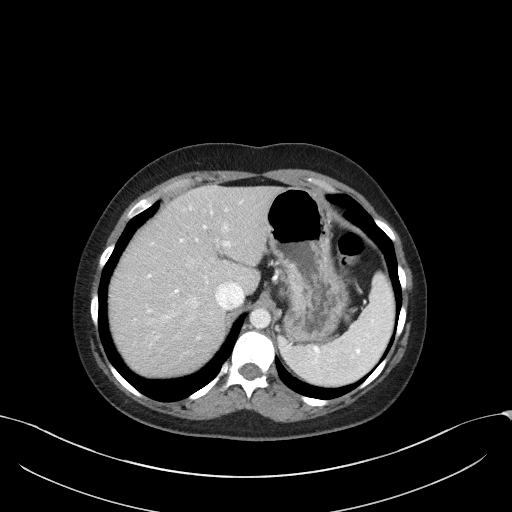
[im 85/89  soft-tissue]
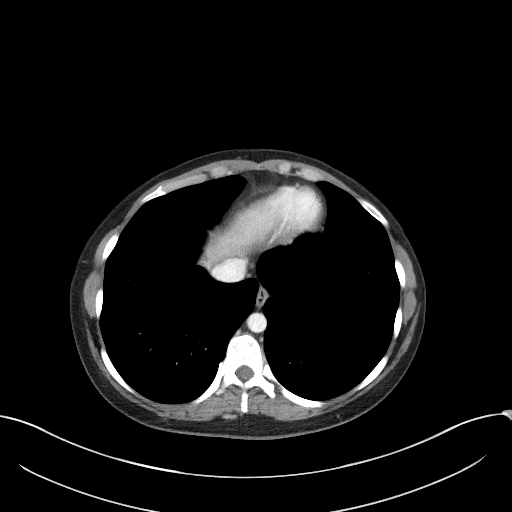

[Series 5: coronal st · coronal · 0.66mm/px · 3 of 101 slices shown]
[im 34/101  soft-tissue]
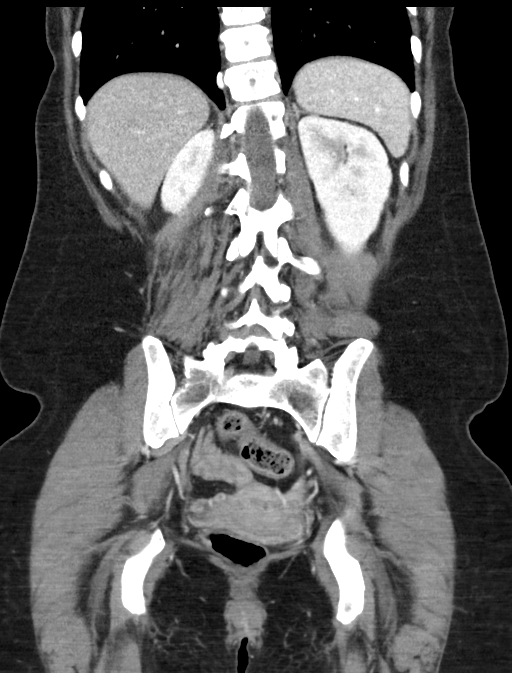
[im 45/101  soft-tissue]
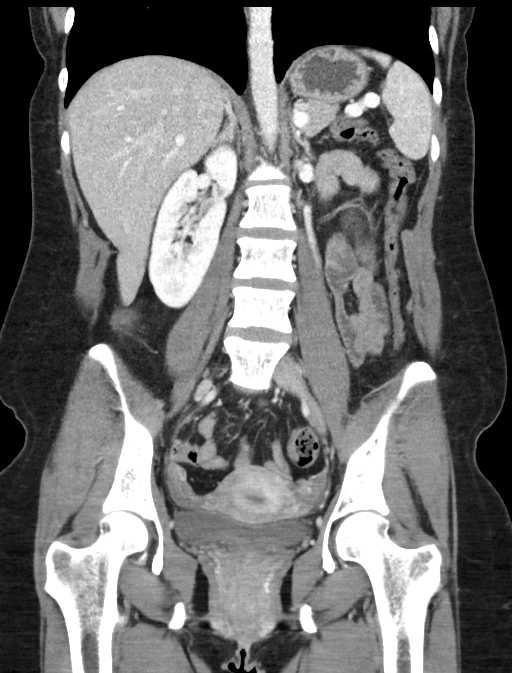
[im 56/101  soft-tissue]
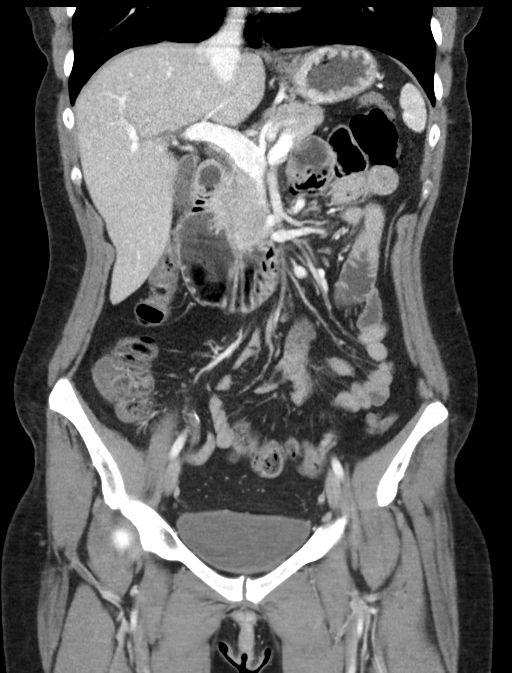

[15 of 46 positions shown; findings below may reference images not displayed]

FINDINGS: Lower chest: Lung bases are clear. Normal heart size. No pericardial
effusion.

Hepatobiliary: No worrisome focal liver lesions. Smooth liver
surface contour. Normal hepatic attenuation for phase of contrast
timing. Stable 8 mm hyperdense focus towards the gallbladder fundus.
No visible gallstone. No pericholecystic fluid or inflammation. No
biliary ductal dilatation.

Pancreas: No pancreatic ductal dilatation or surrounding
inflammatory changes.

Spleen: Normal in size. No concerning splenic lesions.

Adrenals/Urinary Tract: Normal adrenals. No visible or contour
deforming renal lesion. Bilateral extrarenal pelves with some
conspicuous asymmetric layering attenuation within the left renal
pelvis. No urolithiasis or hydronephrosis. Urinary bladder is
unremarkable.

Stomach/Bowel: Distal esophagus, stomach and duodenal sweep are
unremarkable. Borderline air and fluid distention of the proximal
jejunum with more normal caliber of the distal small bowel. No
discrete transition point. No colonic thickening or dilatation. No
other evidence of obstruction. The appendix is surgically absent.

Vascular/Lymphatic: No significant vascular findings are present. No
enlarged abdominal or pelvic lymph nodes.

Reproductive: Peripherally enhancing, crenulated structure, likely
involuting follicle, in the left adnexa measuring up to 1.4 cm in
size. No concerning adnexal mass or lesion. Anteverted uterus.
Subendometrial enhancement is often contrast timing related.

Other: Trace low-attenuation free fluid in the pelvis. No free air.
No bowel containing hernias.

Musculoskeletal: Levocurvature lumbar spine. No acute osseous
abnormality or suspicious osseous lesion.
IMPRESSION: 1. Some asymmetric layering hyperattenuation within the left
extrarenal pelvis, could reflect some early excretion of contrast
versus debris. Recommend correlation with urinalysis. Frank
urothelial thickening is less favored. No borderline air and fluid
distended loops of proximal jejunum, possibly related to recent
ingestion versus a focal ileus. No conspicuous wall thickening. No
other sites of ileus or obstruction.
2. Likely collapsing follicle in the left ovary measuring 1.4 cm in
size. No routine follow-up imaging is warranted. Note: This
recommendation does not apply to those with increased risk (genetic,
family history, elevated tumor markers or other high-risk factors)
of ovarian cancer. Reference: JACR [DATE]):[id number]
3. No other acute or worrisome CT abnormality.

## 2022-01-09 IMAGING — CT CT RENAL STONE PROTOCOL
2 of 4 series · 16 of 46 positions shown, 18 images · non-contrast
Comparison: 07/19/2020

CLINICAL DATA: Right lower abdominal pain

EXAM:
CT ABDOMEN AND PELVIS WITHOUT CONTRAST
TECHNIQUE: Multidetector CT imaging of the abdomen and pelvis was performed
following the standard protocol without IV contrast.

[Series 3: ap without · axial · non-contrast · 0.65mm/px · z∈[-424,-20]mm · 13 of 91 slices shown, 15 images]
[im 5/91  soft-tissue]
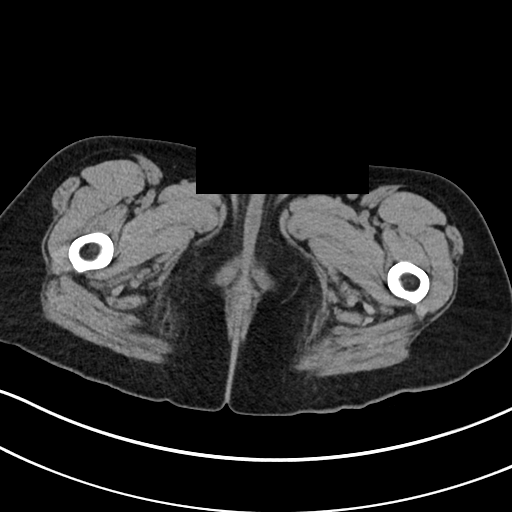
[im 5/91  bone]
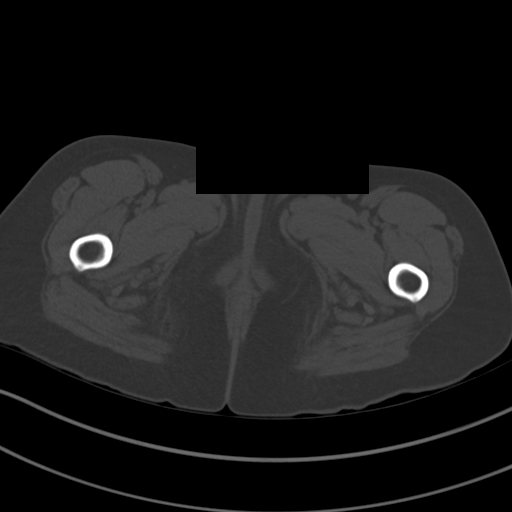
[im 15/91  soft-tissue]
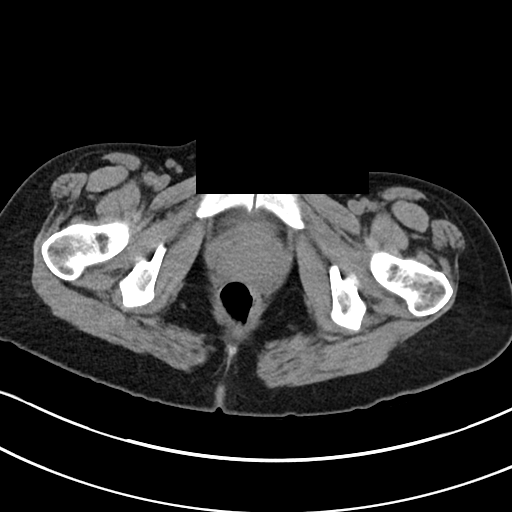
[im 19/91  soft-tissue]
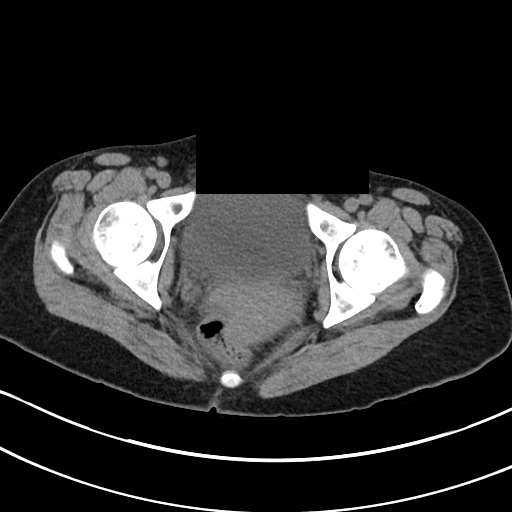
[im 24/91  soft-tissue]
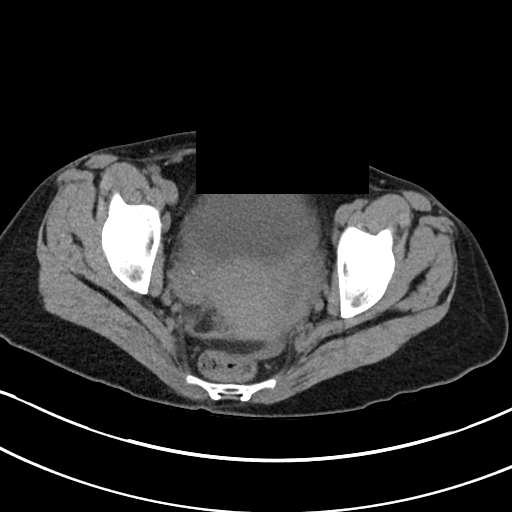
[im 34/91  soft-tissue]
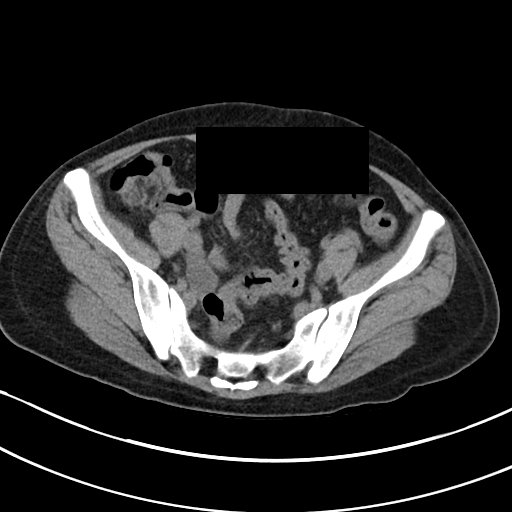
[im 38/91  soft-tissue]
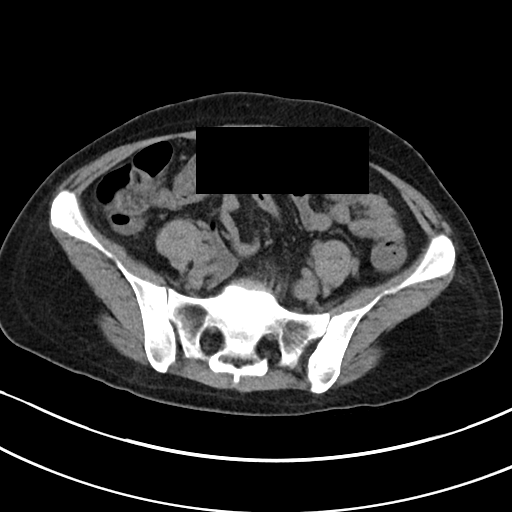
[im 48/91  soft-tissue]
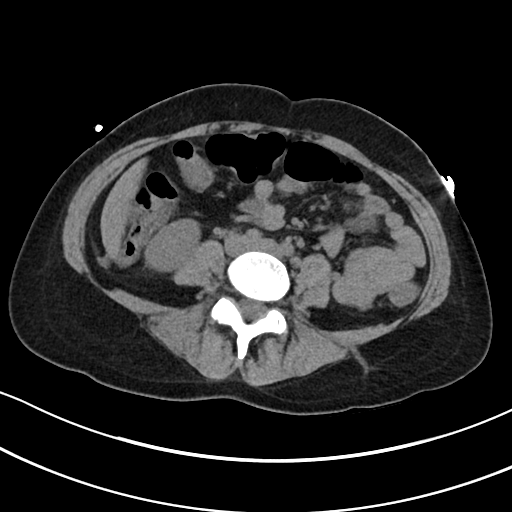
[im 53/91  soft-tissue]
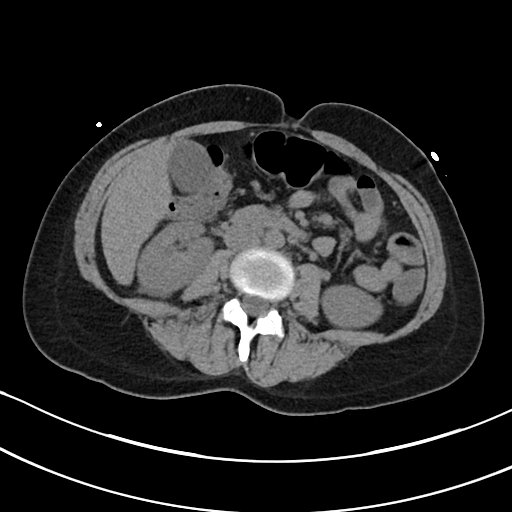
[im 57/91  soft-tissue]
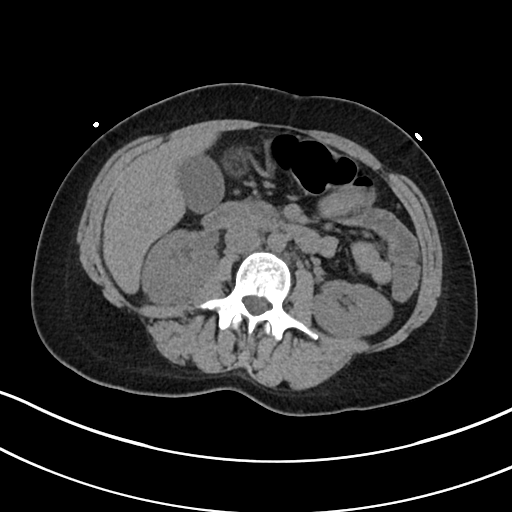
[im 57/91  bone]
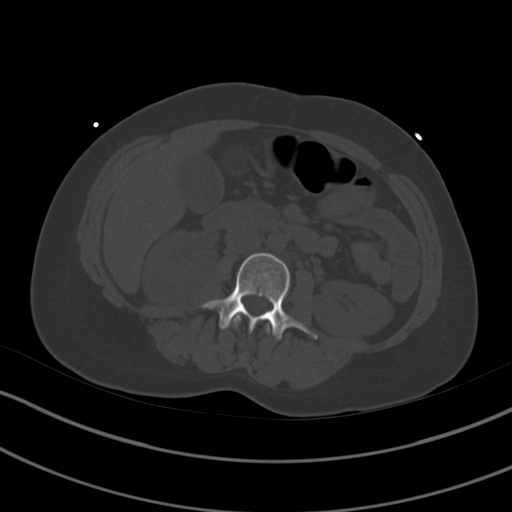
[im 67/91  soft-tissue]
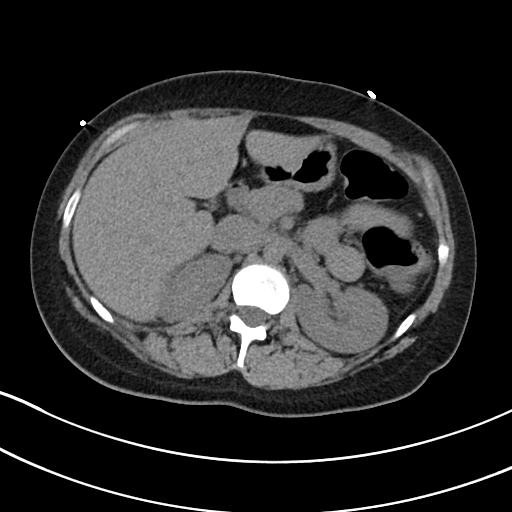
[im 72/91  soft-tissue]
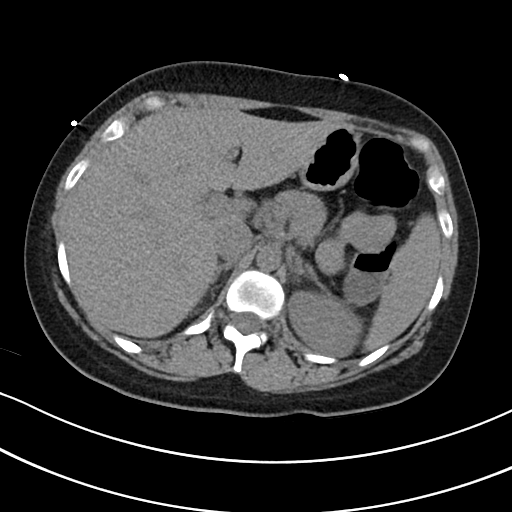
[im 76/91  soft-tissue]
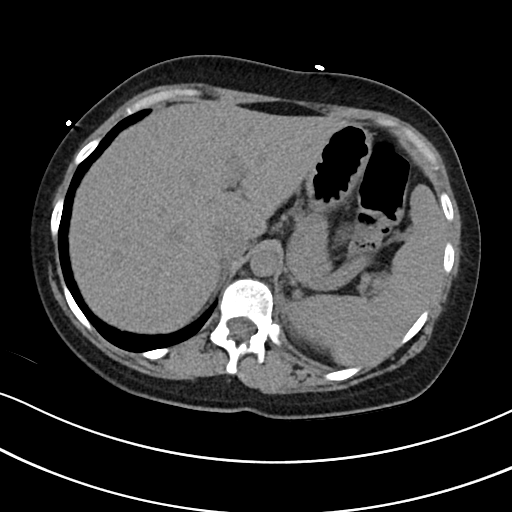
[im 86/91  soft-tissue]
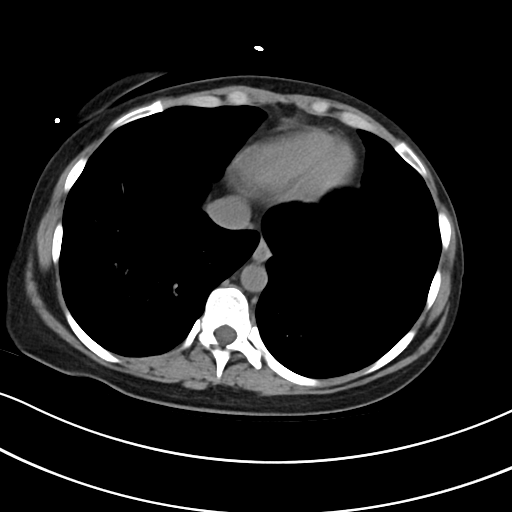

[Series 6: cor · coronal · 0.60mm/px · 3 of 81 slices shown]
[im 27/81  soft-tissue]
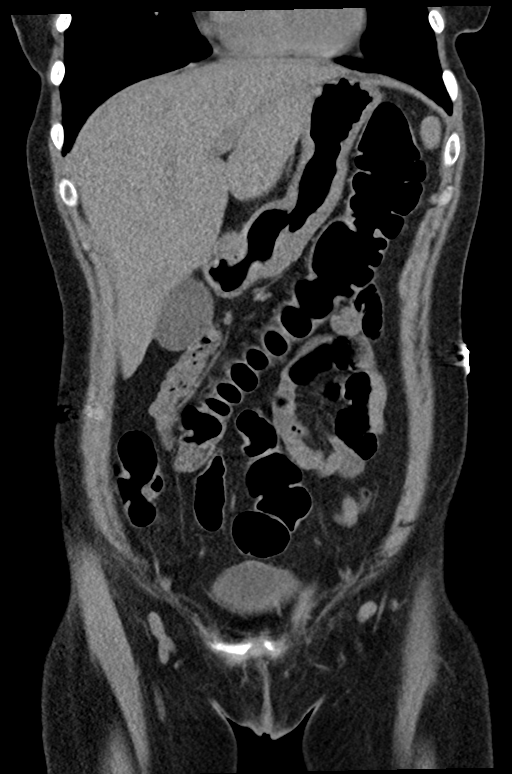
[im 36/81  soft-tissue]
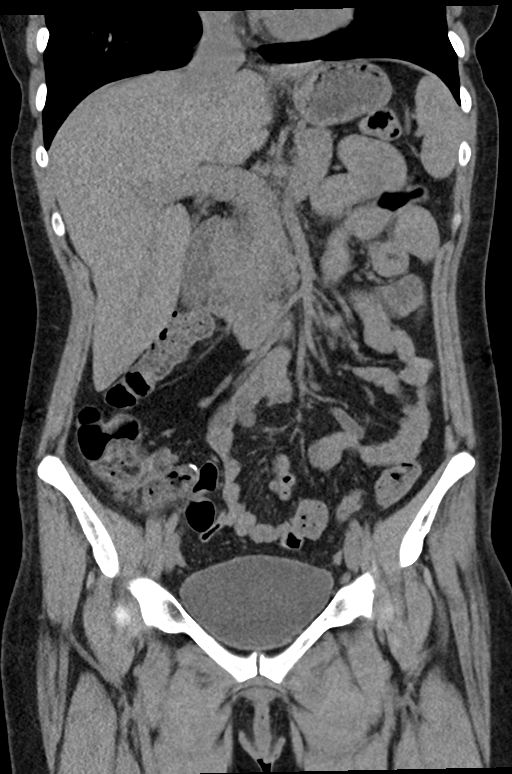
[im 45/81  soft-tissue]
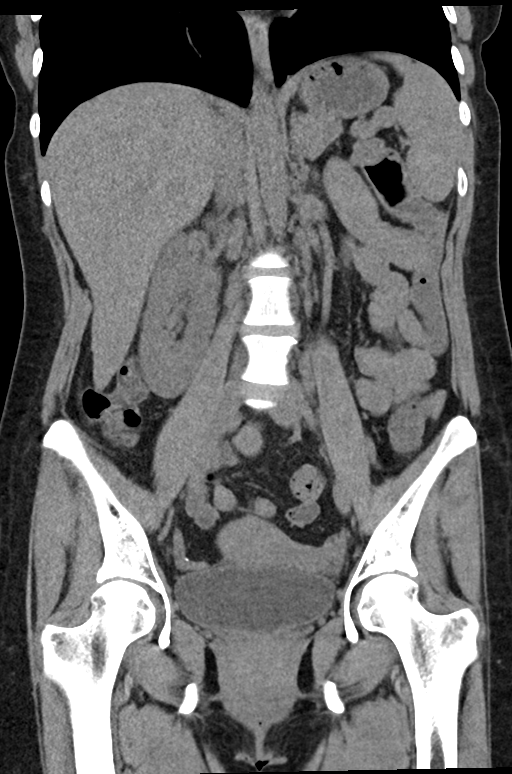

[16 of 46 positions shown; findings below may reference images not displayed]

FINDINGS: Lower chest: Lung bases are clear. No effusions. Heart is normal
size.

Hepatobiliary: No focal hepatic abnormality. Gallbladder
unremarkable.

Pancreas: No focal abnormality or ductal dilatation.

Spleen: No focal abnormality.  Normal size.

Adrenals/Urinary Tract: No adrenal abnormality. No focal renal
abnormality. No stones or hydronephrosis. Urinary bladder is
unremarkable.

Stomach/Bowel: Prior appendectomy. Stomach, large and small bowel
grossly unremarkable.

Vascular/Lymphatic: No evidence of aneurysm or adenopathy.

Reproductive: Uterus and adnexa unremarkable.  No mass.

Other: Trace free fluid in the cul-de-sac.  No free air.

Musculoskeletal: No acute bony abnormality.
IMPRESSION: No renal or ureteral stones.  No hydronephrosis.

No acute findings in the abdomen or pelvis.

## 2023-03-26 NOTE — Progress Notes (Signed)
 New Patient Office Visit  Subjective   Patient ID: Kelli Hoover, female    DOB: [birth year]  Age: 33 y.o. MRN: [id number]  CC:  Chief Complaint  Patient presents with   Establish Care    HPI Kelli Hoover presents 3/10/205 to establish care and concerns for lower back pain  Anxiety Was incarcerated for 28 months was released 09/30/2022 currently on parole for 1 yr Reporst feeling down , thoughts are very negative " I work and go to sleep" no SI/HI  Getting 9-10 hrs of sleep , "eating way too much". She is requesting Benzo " I was On xanax before my incarceration and wold like to restart it".She declines counseling, but requesting a referral to psychiatry.  Flowsheet Row Office Visit from 03/30/2023 in [facility]  PHQ-9 Total Score 4          03/30/2023    3:21 PM 02/07/2015    3:56 PM  GAD 7 : Generalized Anxiety Score  Nervous, Anxious, on Edge 1 0  Control/stop worrying 1 1  Worry too much - different things 1 1  Trouble relaxing 0 1  Restless 0 1  Easily annoyed or irritable 0 1  Afraid - awful might happen 1 1  Total GAD 7 Score 4 6  Anxiety Difficulty Somewhat difficult Not difficult at all   Chronic back pain: Patient presents for presents evaluation of low back problems.  Symptoms have been present for 1-year and include pain in lower back that radiated to right leg (dull in character; 5/10 in severity). Initial inciting event:  unknown . Symptoms are worst with long standing   . Exacerbating factors identifiable by patient are bending forwards and lifting . Treatments so far initiated by patient:  none  Previous lower back problems: none. Previous workup: none   Outpatient Encounter Medications as of 03/30/2023  Medication Sig   cyclobenzaprine (FLEXERIL) 5 MG tablet Take 1 tablet (5 mg total) by mouth 3 (three) times daily as needed for muscle spasms.   FLUoxetine (PROZAC) 10 MG capsule Take 1 capsule (10 mg total) by mouth  daily.   [DISCONTINUED] cefdinir (OMNICEF) 300 MG capsule Take 1 capsule (300 mg total) by mouth 2 (two) times daily. (Patient not taking: Reported on 03/30/2023)   [DISCONTINUED] doxycycline (VIBRAMYCIN) 100 MG capsule Take 1 capsule (100 mg total) by mouth 2 (two) times daily. (Patient not taking: Reported on 03/30/2023)   [DISCONTINUED] haloperidol (HALDOL) 5 MG tablet Take 1 tablet twice daily as needed for cannabis hyperemesis syndrome. (Patient not taking: Reported on 03/30/2023)   [DISCONTINUED] metroNIDAZOLE (FLAGYL) 500 MG tablet Take 1 tablet (500 mg total) by mouth 2 (two) times daily. (Patient not taking: Reported on 03/30/2023)   [DISCONTINUED] naproxen (NAPROSYN) 500 MG tablet Take 1 tablet (500 mg total) by mouth 2 (two) times daily as needed for moderate pain. (Patient not taking: Reported on 03/30/2023)   [DISCONTINUED] ondansetron (ZOFRAN ODT) 4 MG disintegrating tablet Take 1 tablet (4 mg total) by mouth every 8 (eight) hours as needed for nausea or vomiting. (Patient not taking: Reported on 03/30/2023)   [DISCONTINUED] pantoprazole (PROTONIX) 40 MG tablet Take 1 tablet every morning at least 30 minutes before first dose of Carafate. (Patient not taking: Reported on 03/30/2023)   [DISCONTINUED] sucralfate (CARAFATE) 1 g tablet Take 1 tablet (1 g total) by mouth 4 (four) times daily -  with meals and at bedtime. (Patient not taking: Reported on 03/30/2023)   No  facility-administered encounter medications on file as of 03/30/2023.    Past Medical History:  Diagnosis Date   Migraine    Miscarriage    Panic attack     Past Surgical History:  Procedure Laterality Date   APPENDECTOMY     DILATION AND CURETTAGE OF UTERUS     INDUCED ABORTION     LAPAROSCOPIC APPENDECTOMY N/A 07/13/2018   Procedure: LAPAROSCOPIC APPENDECTOMY;  Surgeon: Franky Macho, MD;  Location: [facility];  Service: General;  Laterality: N/A;    Family History  Problem Relation Age of Onset   Hypertension Mother     Anxiety disorder Mother    Cancer Other    Hypertension Other    Anxiety disorder Sister    Anxiety disorder Maternal Grandmother    Colon cancer Maternal Grandmother        age greater than 7   Ulcers Paternal Aunt     Social History   Socioeconomic History   Marital status: Single    Spouse name: Not on file   Number of children: Not on file   Years of education: Not on file   Highest education level: Not on file  Occupational History   Not on file  Tobacco Use   Smoking status: Former    Current packs/day: 1.00    Average packs/day: 1 pack/day for 7.0 years (7.0 ttl pk-yrs)    Types: Cigarettes   Smokeless tobacco: Never  Vaping Use   Vaping status: Never Used  Substance and Sexual Activity   Alcohol use: Not Currently   Drug use: Not Currently    Types: Marijuana   Sexual activity: Yes    Birth control/protection: None  Other Topics Concern   Not on file  Social History Narrative   Not on file   Social Drivers of Health   Financial Resource Strain: Not on file  Food Insecurity: Not on file  Transportation Needs: Not on file  Physical Activity: Not on file  Stress: Not on file  Social Connections: Unknown (11/18/2022)   [facility]   Social Network    Social Network: Not on file  Intimate Partner Violence: Unknown (11/18/2022)   Received from [facility]   HITS    Physically Hurt: Not on file    Insult or Talk Down To: Not on file    Threaten Physical Harm: Not on file    Scream or Curse: Not on file    Review of Systems  Constitutional:  Negative for chills and fever.  HENT:  Negative for ear pain and sore throat.   Respiratory:  Negative for cough and shortness of breath.   Cardiovascular:  Negative for chest pain and leg swelling.  Gastrointestinal:  Negative for constipation, diarrhea, nausea and vomiting.  Musculoskeletal:  Positive for back pain. Negative for myalgias.       Lower back  Skin:  Negative for itching and  rash.  Neurological:  Negative for dizziness and headaches.  Endo/Heme/Allergies:  Negative for environmental allergies and polydipsia. Does not bruise/bleed easily.  Psychiatric/Behavioral:  Negative for depression. The patient is nervous/anxious. The patient does not have insomnia.    Negative unless indicated in HPI    Objective   BP 93/60   Pulse 78   Temp 98.1 F (36.7 C) (Temporal)   Ht 5' (1.524 m)   Wt 127 lb 3.2 oz (57.7 kg)   SpO2 100%   BMI 24.84 kg/m   Physical Exam Vitals and nursing note reviewed.  Constitutional:      General: She is not in acute distress.    Appearance: Normal appearance.  HENT:     Head: Normocephalic and atraumatic.     Right Ear: Tympanic membrane, ear canal and external ear normal. There is no impacted cerumen.     Left Ear: Tympanic membrane and external ear normal. There is no impacted cerumen.     Nose: Nose normal.     Mouth/Throat:     Mouth: Mucous membranes are moist.  Eyes:     General: No scleral icterus.    Extraocular Movements: Extraocular movements intact.     Conjunctiva/sclera: Conjunctivae normal.     Pupils: Pupils are equal, round, and reactive to light.  Neck:     Vascular: No carotid bruit.  Cardiovascular:     Heart sounds: Normal heart sounds.  Pulmonary:     Effort: Pulmonary effort is normal.     Breath sounds: Normal breath sounds.  Musculoskeletal:        General: Normal range of motion.     Cervical back: Normal range of motion and neck supple. No rigidity or tenderness.     Right lower leg: No edema.     Left lower leg: No edema.  Lymphadenopathy:     Cervical: No cervical adenopathy.  Skin:    General: Skin is warm and dry.     Findings: No rash.  Neurological:     Mental Status: She is alert and oriented to person, place, and time.  Psychiatric:        Mood and Affect: Mood normal.        Behavior: Behavior normal.        Thought Content: Thought content normal.        Judgment: Judgment  normal.     Last CBC Lab Results  Component Value Date   WBC 8.1 09/27/2020   HGB 13.4 09/27/2020   HCT 38.0 09/27/2020   MCV 89.2 09/27/2020   MCH 31.5 09/27/2020   RDW 14.1 09/27/2020   PLT 132 (L) 09/27/2020   Last metabolic panel Lab Results  Component Value Date   GLUCOSE 83 09/27/2020   NA 134 (L) 09/27/2020   K 3.2 (L) 09/27/2020   CL 102 09/27/2020   CO2 24 09/27/2020   BUN 9 09/27/2020   CREATININE 0.59 09/27/2020   GFRNONAA >60 09/27/2020   CALCIUM 8.6 (L) 09/27/2020   PROT 6.6 09/27/2020   ALBUMIN 3.7 09/27/2020   BILITOT 0.3 09/27/2020   ALKPHOS 50 09/27/2020   AST 18 09/27/2020   ALT 14 09/27/2020   ANIONGAP 8 09/27/2020         Assessment & Plan:  Encounter for general adult medical examination with abnormal findings -     CBC with Differential/Platelet -     CMP14+EGFR -     Thyroid Panel With TSH -     Lipid panel -     HepB+HepC+HIV Panel  Right-sided low back pain with right-sided sciatica, unspecified chronicity -     Cyclobenzaprine HCl; Take 1 tablet (5 mg total) by mouth 3 (three) times daily as needed for muscle spasms.  Dispense: 30 tablet; Refill: 0  GAD (generalized anxiety disorder) -     Ambulatory referral to Psychiatry -     Pregnancy, urine -     FLUoxetine HCl; Take 1 capsule (10 mg total) by mouth daily.  Dispense: 30 capsule; Refill: 1  Kelli Hoover is a 33 year old Caucasian female seen today to  establish care, no acute distress GAD: Will start Prozac 10 mg daily, refer client to psychiatry, she is requesting to restart Xanax Back pain: Start Flexeril 5 mg 3 times daily as needed; if not relieved will consider x-ray and refer client to Ortho Labs: CMP, CBC, TSH, lipid, HIV, hep C, hep B result pending Encourage healthy lifestyle choices, including diet (rich in fruits, vegetables, and lean proteins, and low in salt and simple carbohydrates) and exercise (at least 30 minutes of moderate physical activity daily).     The above  assessment and management plan was discussed with the patient. The patient verbalized understanding of and has agreed to the management plan. Patient is aware to call the clinic if they develop any new symptoms or if symptoms persist or worsen. Patient is aware when to return to the clinic for a follow-up visit. Patient educated on when it is appropriate to go to the emergency department.  Return in about 6 weeks (around 05/11/2023) for mental health.   Arrie Aran Santa Lighter, [facility] [address] [place], [place] [address] [phone]  Note: This document was prepared by [employer] voice dictation technology and any errors that results from this process are unintentional.

## 2023-03-30 ENCOUNTER — Ambulatory Visit: Payer: Self-pay | Admitting: Nurse Practitioner

## 2023-03-30 ENCOUNTER — Encounter: Payer: Self-pay | Admitting: Nurse Practitioner

## 2023-03-30 VITALS — BP 93/60 | HR 78 | Temp 98.1°F | Ht 60.0 in | Wt 127.2 lb

## 2023-03-30 DIAGNOSIS — Z0001 Encounter for general adult medical examination with abnormal findings: Secondary | ICD-10-CM

## 2023-03-30 DIAGNOSIS — M5441 Lumbago with sciatica, right side: Secondary | ICD-10-CM

## 2023-03-30 DIAGNOSIS — F411 Generalized anxiety disorder: Secondary | ICD-10-CM | POA: Diagnosis not present

## 2023-03-30 LAB — PREGNANCY, URINE: Preg Test, Ur: NEGATIVE

## 2023-03-30 MED ORDER — FLUOXETINE HCL 10 MG PO CAPS: 10.0000 mg | ORAL_CAPSULE | Freq: Every day | ORAL | 1 refills | Status: DC

## 2023-03-30 MED ORDER — CYCLOBENZAPRINE HCL 5 MG PO TABS: 5.0000 mg | ORAL_TABLET | Freq: Three times a day (TID) | ORAL | 0 refills | Status: AC | PRN

## 2023-04-01 ENCOUNTER — Telehealth: Payer: Self-pay

## 2023-04-01 LAB — CMP14+EGFR
ALT: 13 IU/L (ref 0–32)
AST: 13 IU/L (ref 0–40)
Albumin: 4.4 g/dL (ref 3.9–4.9)
Alkaline Phosphatase: 52 IU/L (ref 44–121)
BUN/Creatinine Ratio: 11 (ref 9–23)
BUN: 8 mg/dL (ref 6–20)
Bilirubin Total: 0.3 mg/dL (ref 0.0–1.2)
CO2: 23 mmol/L (ref 20–29)
Calcium: 9 mg/dL (ref 8.7–10.2)
Chloride: 105 mmol/L (ref 96–106)
Creatinine, Ser: 0.72 mg/dL (ref 0.57–1.00)
Globulin, Total: 2.4 g/dL (ref 1.5–4.5)
Glucose: 79 mg/dL (ref 70–99)
Potassium: 4.4 mmol/L (ref 3.5–5.2)
Sodium: 139 mmol/L (ref 134–144)
Total Protein: 6.8 g/dL (ref 6.0–8.5)
eGFR: 115 mL/min/{1.73_m2} (ref >59)

## 2023-04-01 LAB — CBC WITH DIFFERENTIAL/PLATELET
Basophils Absolute: 0 10*3/uL (ref 0.0–0.2)
Basos: 0 %
EOS (ABSOLUTE): 0.1 10*3/uL (ref 0.0–0.4)
Eos: 1 %
Hematocrit: 40.9 % (ref 34.0–46.6)
Hemoglobin: 13.7 g/dL (ref 11.1–15.9)
Immature Grans (Abs): 0.1 10*3/uL (ref 0.0–0.1)
Immature Granulocytes: 1 %
Lymphocytes Absolute: 2.8 10*3/uL (ref 0.7–3.1)
Lymphs: 34 %
MCH: 31.4 pg (ref 26.6–33.0)
MCHC: 33.5 g/dL (ref 31.5–35.7)
MCV: 94 fL (ref 79–97)
Monocytes Absolute: 0.5 10*3/uL (ref 0.1–0.9)
Monocytes: 6 %
Neutrophils Absolute: 4.8 10*3/uL (ref 1.4–7.0)
Neutrophils: 58 %
Platelets: 262 10*3/uL (ref 150–450)
RBC: 4.36 x10E6/uL (ref 3.77–5.28)
RDW: 12.2 % (ref 11.7–15.4)
WBC: 8.3 10*3/uL (ref 3.4–10.8)

## 2023-04-01 LAB — HEPB+HEPC+HIV PANEL
Hep B C IgM: NEGATIVE
Hep B Core Total Ab: NEGATIVE
Hep B E Ab: NONREACTIVE
Hep B E Ag: NEGATIVE
Hep B Surface Ab, Qual: REACTIVE
Hep C Virus Ab: NONREACTIVE
Hepatitis B Surface Ag: NEGATIVE

## 2023-04-01 LAB — THYROID PANEL WITH TSH
Free Thyroxine Index: 1.7 (ref 1.2–4.9)
T3 Uptake Ratio: 23 % — ABNORMAL LOW (ref 24–39)
T4, Total: 7.3 ug/dL (ref 4.5–12.0)
TSH: 1.07 u[IU]/mL (ref 0.450–4.500)

## 2023-04-01 LAB — LIPID PANEL
Chol/HDL Ratio: 3.7 ratio (ref 0.0–4.4)
Cholesterol, Total: 196 mg/dL (ref 100–199)
HDL: 53 mg/dL (ref >39)
LDL Chol Calc (NIH): 127 mg/dL — ABNORMAL HIGH (ref 0–99)
Triglycerides: 87 mg/dL (ref 0–149)
VLDL Cholesterol Cal: 16 mg/dL (ref 5–40)

## 2023-04-01 NOTE — Telephone Encounter (Signed)
 Copied from CRM [id number]. Topic: Clinical - Lab/Test Results >> Apr 01, 2023  8:46 AM Higinio Roger wrote: Reason for CRM: Patient is requesting a callback to discuss labs. Callback #: [phone].

## 2023-05-18 ENCOUNTER — Ambulatory Visit: Admitting: Nurse Practitioner

## 2023-05-18 NOTE — Progress Notes (Deleted)
   Established Patient Office Visit  Subjective  Patient ID: Kelli Hoover, female    DOB: [birth year]  Age: 33 y.o. MRN: [id number]  No chief complaint on file.   HPI  {History (Optional):23778}  ROS Negative unless indicated in HPI   Objective:     There were no vitals taken for this visit. {Vitals History (Optional):23777}  Physical Exam   No results found for any visits on 05/21/23.  {Labs (Optional):23779}    Assessment & Plan:  There are no diagnoses linked to this encounter.  No follow-ups on file.    @Darlean Warmoth  Hildy Lowers, [place]    Note: This document was prepared by [employer] voice dictation technology and any errors that results from this process are unintentional.

## 2023-05-21 ENCOUNTER — Ambulatory Visit: Admitting: Nurse Practitioner

## 2023-05-27 ENCOUNTER — Ambulatory Visit: Admitting: Nurse Practitioner

## 2023-05-28 ENCOUNTER — Encounter (HOSPITAL_COMMUNITY): Payer: Self-pay

## 2023-06-04 ENCOUNTER — Ambulatory Visit: Admitting: Nurse Practitioner

## 2023-06-04 ENCOUNTER — Encounter: Payer: Self-pay | Admitting: Nurse Practitioner

## 2023-06-09 ENCOUNTER — Telehealth (HOSPITAL_COMMUNITY): Payer: Self-pay

## 2023-06-09 NOTE — Telephone Encounter (Signed)
 Lvm to confirm 06/11/23 appt by 12pm 06/10/23

## 2023-06-11 ENCOUNTER — Ambulatory Visit (HOSPITAL_COMMUNITY): Admitting: Registered Nurse

## 2023-06-11 ENCOUNTER — Encounter (HOSPITAL_COMMUNITY): Payer: Self-pay | Admitting: Registered Nurse

## 2023-06-11 DIAGNOSIS — F411 Generalized anxiety disorder: Secondary | ICD-10-CM

## 2023-06-11 DIAGNOSIS — F3130 Bipolar disorder, current episode depressed, mild or moderate severity, unspecified: Secondary | ICD-10-CM

## 2023-06-11 DIAGNOSIS — F422 Mixed obsessional thoughts and acts: Secondary | ICD-10-CM

## 2023-06-11 MED ORDER — HYDROXYZINE HCL 10 MG PO TABS: ORAL_TABLET | ORAL | 0 refills | Status: DC

## 2023-06-11 MED ORDER — QUETIAPINE FUMARATE ER 50 MG PO TB24: 50.0000 mg | ORAL_TABLET | Freq: Every day | ORAL | 0 refills | Status: DC

## 2023-06-11 MED ORDER — FLUOXETINE HCL 20 MG PO CAPS: 20.0000 mg | ORAL_CAPSULE | Freq: Every day | ORAL | 1 refills | Status: DC

## 2023-06-11 MED ORDER — QUETIAPINE FUMARATE 25 MG PO TABS: 25.0000 mg | ORAL_TABLET | Freq: Every day | ORAL | 0 refills | Status: DC | PRN
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
## 2023-06-11 NOTE — Patient Instructions (Addendum)
 Call 911, 988, mobile crisis, or present to the nearest emergency room should you experience any suicidal/homicidal ideation, auditory/visual/hallucinations, or detrimental worsening of your mental health.  Mobile Crisis Response Teams Listed by counties in vicinity of [facility] providers [facility] [phone] [facility] [phone] [facility] [phone] [facility] [phone] [place]                * [facility] [phone]                * [facility] [phone]  [facility] [phone] [facility]  [phone] * [facility] [phone]      Here are a List of outpatient providers that accept Medicaid and private insurance.  They offer counseling/therapy virtually some also offer in person:    [facility] Phone: [phone] Physical Address:  [address], [address] [place], [place]  [address]  Outpatient Services Life can be a challenge for [place] all. [facility] outpatient services offer a caring and experienced team of professionals who help people take the first step, which is often the most difficult. Together, we develop a well-defined and customized plan for each person that meets the individual's needs and goals. Each plan includes evidence-based practices as proven strategies that work. From board-certified psychiatrists, registered nurses, therapists, and outpatient office administrative professionals--all care and want to help you and your loved ones in every way possible to ensure you succeed.  Open Access:   One way we ensure we get people the help they need when they request is is through Open Access. This service encourages individuals who are in dire  need of our services and are new to [facility] to simply walk in or call us for virtual options, Monday through Friday between 8 a.m. and 3 p.m. On the same day of contact, if the individual has time to do so, he/she/they will complete patient registration and a comprehensive clinical assessment with a therapist. The assessment will provide treatment recommendations and the individual will leave with an appointment for the next service or a referral to the proper level of care.  While this process takes a few hours and is longer than a traditional appointment, it reduces what could otherwise be months of waiting for help or an appointment.   Telehealth Services:  [facility] telehealth services provide a safe, secure, and easy way to connect with a therapist or mental health provider for an individual or group therapy appointment. Click here to learn more about how [facility] telehealth services provide an important treatment option. These services may be accessed from the comfort of an individual's home, or at one of [facility]'s behavioral health offices such as this one where an individual may use on-site equipment for the visit.   Telehealth Services   A SAFE, SECURE, CONVENIENT TREATMENT OPTION:  [facility] telehealth services provide you with a safe, secure, and easy way to connect with your therapist or mental health provider for an individual or group therapy appointment.  Using Psychologist, prison and probation services, telehealth appointments allow you to meet with [employer], therapists, nurse practitioners, and psychiatrists from your desktop or laptop computer, cell phone, or tablet device. Telehealth visits are compliant with all Health Insurance Portability and Accountability Act (HIPAA) requirements and you can complete a telehealth visit from just about anywhere using internet or wi-fi access.  HOW DOES IT WORK?  [facility] uses  the Doxy.me platform to host telehealth appointments. Prior to your scheduled visit,  you will receive a direct link via text or email which will take you to your provider's online waiting room. Simply click that link at your appointment time and your provider will be notified that you've arrived. He or she will meet you online and you will complete your visit. Your provider may also have resources and information posted in his or her virtual waiting room which you may find helpful throughout your treatment.  In addition, you may receive a reminder telephone call from a [place] [facility] team member in the days leading up to your appointment. During that call, you will have an opportunity to provide important health information and medication updates which may save time during your scheduled appointment.     WHO USES TELEHEALTH SERVICES?  Telehealth services provide an alternative to in-person, face-to-face treatment for individuals receiving outpatient behavioral health services. At [facility], telehealth visits may also be used by individuals receiving Assertive Community Treatment (ACT) Team and Individual Placement and Support (IPS) services and other community-based, specialized services as needed. Telehealth services are also used for group therapy sessions, allowing people we support to connect during treatment with others who have similar experiences.       Address:  [address] [place], [place] [address] Outpatient Hours: Mon-Fri 8AM to 5PM Phone: [phone] Fax: [phone] Offers: [facility]) Hours: 24/7 Phone: [phone] Fax: [phone] --- Address:  [address] [place], [place] [address] Hours: Mon-Fri 8AM-5PM Phone: [phone] Fax: [phone]  Address: [address] [place], [place] [address] Hours: Outpatient: Mon-Fri 8AM to 5PM [facility]) Hours: 24/7 Phone: [phone] Fax: [phone]  Services: Adult Services Advanced Access Walk-In Assessments - All Disabilities If  you're a walk-in patient there is generally a wait time involved on a "first come, first served basis" otherwise contact us beforehand to setup an appointment. If you're in crisis please use our 24-Hour Crisis Hotline for immediate access to a clinician. If this is your first time, please bring the following with you:   Insurance/Medicaid Card  ID or Social Security Card Any referral documentation Routine Outpatient Therapy Psychiatry/Med Management Substance Abuse Intensive Outpatient (SAIOP) Medication Assisted Treatment - MAT (Suboxone) Tailored Care Management (TCM) Youth Services Advanced Access Walk-In Assessments - All Disabilities Routine Outpatient Therapy Psychiatry/Med Management Tailored Care Management (TCM)   Behavioral Health Care in [place], [place] [facility] in [place], [place] provides Integrated Behavioral Health Services to people of all ages. The Integrated Behavioral Health Program applies an evidence-based approach to integrate behavioral health into primary care. This model incorporates mental health treatment into a traditional medical visit. Our philosophical standard values comprehensive wellness for patients. The program is led by Dale [place], [facility] Director. Aimee is a Administrator, Civil Service (LCSW). Our organizational vision in implementing this program is to improve patient wellness by serving as a Publishing rights manager for thorough healthcare management. [facility] provides high-quality care for behavioral health patients of all ages, including: Common mental health diagnoses such as Anxiety, Depression, ADD/ADHD, Bipolar, and PTSD Substance Abuse Evaluations and Counseling NARCAN/Naloxone Distribution Our Behavioral Health Clinicians [facility]) are ready to help you problem-solve through life stressors to promote healthy coping skills. Together we can identify how past  challenges, such as traumatic events, are contributing to how you're functioning today. Our BHCs are happy to incorporate the principles of  your value system to nurture your healing needs. We offer HIPAA-compliant trauma-informed telehealth and in-person therapy to residents of [place] and [place] at our [place], [place], and [place], [place] medical centers. We look forward to meeting and working with you.  We'd love to hear from you!  OUR CONTACT INFO Address:  [address] [place], [place] [address] Hours Operation Monday 8:00AM - 5:00PM Tuesday 8:00AM - 7:00PM Wednesday 8:00AM - 5:00PM Thursday 8:00AM - 5:00PM Friday 8:00AM - 12:00PM  CALL us P: [phone] F: [phone] Email:  [email or web address]  Facebook/Messenger:  [email or web address]   Hospital doctor:  [email or web address]   Get better, faster with quality mental health care Our providers take a hands-on approach to help you see improvement at every step, no matter how severe your symptoms. Just come as you are and let us take it from there.  Appointments in as little as 2 days Receive quick guidance and support by speaking with a licensed professional in a matter of hours.  Care for even the most severe cases We offer care for mild to severe depression, anxiety, and more --including Crisis Care for adults with elevated suicide risk.  Personalized plans unique to you Our treatment plans are tailored to your specific needs, providing you a clear path to success.  1:1 support from start to finish Get paired with a dedicated provider to serve as your single point of contact throughout treatment.  Results-based care, built for you Your expert provider will ensure your plan is grounded in data, lending support from beginning to end. Choose psychiatry, therapy, or both.  Psychiatry When medication is necessary, our psychiatric providers get it right fast --analyzing 100+ data  points to determine which treatment is likely to be most tolerable and effective for you.  Therapy Our virtual program combines cognitive and behavioral therapy with independent skill practice--all of which have been clinically proven to work for a wide range of symptoms so you can get better, and stay better.  Crisis Care A first-of-its-kind program for individuals with elevated suicide risk, Crisis Care is based on the Collaborative Assessment and Management of Suicidality (CAMS) framework--a care model that's backed by 30 years of research.  Teen Care (new) Give your teen a safe space to connect and get personal support. Our expert therapists help teens 13 and up with many common concerns--from school and peer pressure to anxiety and friendships. Psychiatry services available, if appropriate.  Virtual, dedicated support every step of the way 1:1 Video Sessions Let your provider know how you're feeling, get to know you, and provide 1:1 support.  Anytime Messaging Get questions or concerns off your chest between video visits by messaging your provider at any time.  Interactive Lessons Learn how to integrate new thought and behavior patterns into your daily life.  Proactive Progress Tracking Complete weekly check-ins so your provider can track your progress and, if necessary, adjust your treatment and/or medication.  1:1 Video Sessions Let your provider know how you're feeling, get to know you, and provide 1:1 support.   [facility] [employer], [place].  Address:  [address] [place], [place] [address]  Phone:  [phone] Website:  [email or web address] We are here to serve clients ages 48 - 98, who are challenged by a multitude of behavioral health concerns to include substance use disorders, and complex mental health scenarios where both medical and psychiatric conditions coexist. Moreover, as a trained Healthcare Chaplain, I seek to provide incarnation al  care. The aim  of " [facility]" is to be incarnation al. Therefore, to provide a holistic treatment approach to all who call upon our services.  Behavioral Health Services & Treatments Depression  Substance Use Disorders  Mood Disorders  Psychodynamic Psychotherapy  [place] Charity fundraiser) Therapy Anxiety Disorders ADHD Urine Toxicology Screening Psychiatric Medication Management  Insurance [address], [address], [address], [address] and [place], [address] and [employer], [place], [place], [place], [employer], Control and instrumentation engineer, [employer], [employer] (UMR), UnitedHealthcare UHC  UBH, [employer] of Network    Therapist, sports Health Counselor Associate, [place], [facility], CCTP Available both in-person and online [address] [place], [place] [address]  Phone:  [phone]  Specialties and Soil scientist Anxiety Depression Coping Skills Expertise ADHD Behavioral Issues Oppositional Defiance (ODD) School Issues Self Esteem Stress Trauma and PTSD  How are you doing? Not what you tell others, Be honest with yourself How are you really doing?Marland Kitchen. Let's talk about what's really on your mind. I specialize in supporting individuals, children, and families as they navigate life's challenges and work toward healing. My background includes experience in crisis support as a first responder, which has deepened my understanding of how acute and long-term stress impacts mental health. I use a person-centered approach, ensuring that your unique strengths, experiences, and goals guide our work together. My therapeutic style is eclectic, drawing from evidence-based modalities such as Cognitive Behavioral Therapy (CBT), Trauma-Focused Therapy, mindfulness practices, and somatic techniques to create a personalized path toward wellness.   Susa Simmonds Licensed Clinical Mental Health Counselor Associate, LCMHC-A, [facility] Available both in-person and online Location:   [place] - [address], [place], [place], [address] Phone: [phone] Website:  [email or web address] Hello! I'm Susa Simmonds, LCMHC-A, NCC. I earned my Master's in Clinical Mental Health Counseling and a Certificate in Marriage and Family Counseling from [facility] [employer]. My experience includes roles as a Science writer for individuals on the Autism Spectrum. I gained most of my clinical experience at a children's center, collaborating with a multidisciplinary team to provide top-notch care. I'm passionate about working with children, adolescents, young adults, and families facing anxiety, depression, trauma, self-esteem issues, and relational challenges. I use play therapy and expressive art techniques when traditional talk therapy isn't effective. My approaches include cognitive-behavioral, solution-focused, trauma-focused therapies, and MATCH-ADTC (Modular Approach to Therapy for Children with Anxiety, Depression, Trauma, and Conduct problems). As your therapist, I'll tailor support to your needs and offer at-home activities to help you achieve your goals. My focus is on fostering self-awareness, growth, and confidence in a supportive environment of hope and empowerment. Outside work, I enjoy reading, trying new foods, and spending time with loved ones. Conditions Treated ADHD Anxiety/Phobias/Panic attacks Autism Bipolar disorder Childhood behavioral issues Couple's issues Depression Family Focus and concentration LGBTQ+ Obsessions or compulsions Postpartum or Peripartum Issues PTSD or Trauma Stress management   Nira Conn Counselor, [facility], LCASA, NCC (she, her) Available online only Location:  [place], [place] [address] Phone:  [phone] My clients can expect an environment free of judgment, full of understanding and empathy, where they can face the challenges that  they have in a supportive environment. My personal background allows me to look at client issues from a different view and offer real world healing that meets the client where they are. My client's come from various backgrounds, but all seeking to better understand themselves and a better way of coping. Because I'm seeing clients virtually, they feel  freer to share more intimate and personal concerns that might be harder to talk about in person. Top Specialties LGBTQ+ Sex Therapy Expertise Addiction ADHD Anxiety Behavioral Issues Bipolar Disorder Body Positivity Borderline Personality (BPD) Cancer Child Chronic Pain Coping Skills Depression Life Transitions Marital and Premarital Obesity Open Relationships Non-Monogamy Parenting Peer Relationships Relationship Issues Self Esteem Sex-Positive, Kink Allied Sexual Abuse Sexual Addiction Stress Trauma and PTSD Weight Loss   [employer] Professional, MDiv, Med Available online only [address] [place], [place] [address] Phone:  [phone] Website:  [email or web address] Welcome! My name is Brewing technologist Scales, and I am passionate about helping individuals navigate life's challenges, develop resilience, and unlock their full potential. With over a decade of experience in counseling, ministry, and coaching, I provide a holistic approach to personal and spiritual development, drawing on my diverse professional background and extensive training. Administrator Spirituality Education and Learning Disabilities Mood Disorders Expertise ADHD Anxiety Pension scheme manager Counseling Child Coping Skills Depression Family Conflict First Responders Grief Intellectual Disability Life Coaching Life Transitions Marital and Premarital Parenting Peer Relationships Relationship Issues School Issues Self Esteem Sexual Abuse Teen Violence Trauma and PTSD   Frederic Jericho, MSW,  LCSW[facility] [address] [place], [place] [address] Office [phone] Fax [phone] Sometimes in life trying situations occur that may be difficult to handle alone. My goal is to help you successfully manage these challenges so that you can experience a more balanced and fulfilling life. I offer an eclectic approach to therapy, which includes solution-focused, reality-based, person-centered approaches. I will assist you in breaking the cycle of negative thinking and behaviors. If you are in need of support or guidance in order to make your life more manageable, I welcome the opportunity to assist you in doing so. My goal is to empower you to live a life of personal growth and positive well-being. Please call [phone] or email [email or web address] for an individual and family therapy or consultation today.   Contact Details  Locate [place] at [address], [place], [place] [address] [address] [place] [address] [place], [place] [address]  We are a full telehealth service company as we do not do in person visits.  Message or Call us at  Email:  [email or web address]   Phone: [phone]  FAX: [phone]  Who We Are [facility] is a leading provider of personalized mental health care in [place], [place], dedicated to fostering healing and growth in a safe, compassionate environment. Our team of experienced professionals offers innovative solutions tailored to meet the unique needs of each individual, helping them overcome challenges and improve their overall quality of life. We accept most major insurances.  You Are Our Top Priority  Mission Statement [facility] offers personalized mental health care in a safe and compassionate environment. Our team provides effective treatments and innovative solutions that empower clients to overcome challenges and improve their quality of life.  Vision Statement [facility] aims to  redefine mental health care standards through excellence, compassion, and innovation. We aim to make mental health care accessible and destigmatized, ensuring everyone receives the necessary support to thrive.  Services: Anxiety Depression Bipolar disorder Personality Disorder PTSD ADHD Schizophrenia Mood disorder Insomnia Neuropsychological Testing for ADHD and Other Disorders  Comprehensive neuropsychological testing for a better tomorrow Understanding Neuropsychological Testing Neuropsychological testing is a specialized evaluation method used to assess cognitive functions, behaviors, and mental processing abilities. These tests are essential for diagnosing and managing  conditions such as Attention-Deficit/Hyperactivity Disorder (ADHD), learning disabilities, autism spectrum disorders, mood disorders, and other neurological or psychological conditions. Unlike traditional assessments, neuropsychological testing provides objective, measurable data about an individual's cognitive abilities. This helps clinicians develop personalized treatment plans that address specific challenges and improve daily functioning.  Why Neuropsychological Testing is Important for ADHD ADHD is a complex neurodevelopmental disorder that affects attention, impulsivity, and executive function. Because its symptoms often overlap with other conditions, neuropsychological testing helps differentiate ADHD from other disorders, ensuring an accurate diagnosis and appropriate intervention. Through comprehensive testing, clinicians can evaluate:  Attention and Focus - Identifying difficulties in sustained and selective attention Executive Functioning - Assessing skills such as impulse control, problem-solving, and organization Memory and Processing Speed - Measuring how quickly and efficiently information is understood and retained Behavioral and Emotional Regulation - Understanding mood-related symptoms that may be  associated with ADHD Key Neuropsychological Tests Neuropsychological assessments typically include a combination of standardized tests that evaluate different cognitive domains. Some of the most commonly used tests include:  Continuous Performance Test (CPT) Measures sustained attention and response control. Helps identify inattention, impulsivity, and distractibility. Stroop Test Assesses cognitive flexibility and processing speed. Evaluates an individual's ability to control automatic responses and focus on specific tasks. [employer] Test (WCST) Measures executive function, problem-solving, and cognitive flexibility. Helps assess an individual's ability to adapt to changing rules and instructions. Trail Making Test (TMT) Assesses visual attention, processing speed, and task-switching abilities. Commonly used to detect cognitive impairments in ADHD and other disorders. N-Back Test Evaluates working memory and attentional capacity. Helps measure an individual's ability to hold and manipulate information over short periods.  Neuropsychological Testing for Other Disorders Beyond ADHD, neuropsychological testing is a crucial tool for diagnosing and managing a wide range of neurological and psychological conditions. These assessments can help identify cognitive deficits and guide targeted interventions for various disorders, including: Learning Disabilities Helps assess difficulties in reading, writing, or mathematics Identifies underlying cognitive challenges that may be affecting academic performance Autism Spectrum Disorder (ASD) Evaluates cognitive flexibility, attention, and executive functioning Helps determine the presence of social and communication deficits Mood Disorders (Depression, Anxiety, Bipolar Disorder) Identifies cognitive impairments related to emotional regulation and stress response Assesses memory, attention, and problem-solving skills affected by mood  disorders Traumatic Brain Injury (TBI) and Concussions Measures cognitive changes due to brain injuries Helps monitor recovery progress and guide rehabilitation efforts Dementia and Neurodegenerative Disorders Evaluates memory, processing speed, and executive function in conditions like Alzheimer's and Parkinson's disease Assists in early detection and progression monitoring  Benefits of Neuropsychological Testing Accurate Diagnosis - Differentiates ADHD from other conditions with similar symptoms, such as anxiety or learning disabilities Personalized Treatment Planning - Identifies cognitive strengths and weaknesses to tailor individualized intervention Tracking Progress - Enables clinicians to monitor changes over time and assess the effectiveness of treatments or medication Guiding Educational and Workplace Accommodations - Provides data to support school or workplace modifications for individuals struggling with cognitive challenges  Who Should Consider Neuropsychological Testing? Individuals experiencing difficulties with attention, memory, problem-solving, or emotional regulation may benefit from neuropsychological testing. This assessment is ideal for: Children and adults with suspected ADHD or learning disabilities Individuals with behavioral or emotional challenges affecting daily life Those recovering from neurological injuries or illnesses impacting cognitive function Anyone seeking a deeper understanding of their cognitive abilities to optimize performance in school, work, or daily activities  What to Expect During a Neuropsychological Evaluation The testing process typically includes: Clinical Interview - Gathering medical  and developmental history Standardized Testing - Completing various cognitive tasks on a computer or paper Behavioral Assessments - Using rating scales and questionnaires completed by the individual and/or caregivers Comprehensive Report - A detailed  breakdown of strengths, weaknesses, and personalized recommendations    Offers Virtual Therapy  [address] [place], [place] [address]  Phone:  [phone] Fax: [phone]  [facility] Out[facility] Program (OP) An Outpatient Treatment Program, or OP, is a service for individuals seeking support for substance abuse or mental health concerns who do not require frequent or intense support or safety monitoring. This level is appropriate for people with less severe disorders, or as a step-down from more intensive services. At [facility], OP is available for individuals aged 7 and above. It consists of basic treatment services offered in individual and group format and totals to less than 9 hours a week. Both virtual and in-person options for attending are available. OP is conducted by treatment professionals licensed to serve individuals with mental health and substance use disorders within [place]. OP helps individuals address a broad range of psychological and interpersonal challenges including: Substance Related Disorders Behavioral Addictions Anxiety Depression Trauma and Stressor Related Issues PTSD Parenting and Family Issues Relationship Issues Stress Self-Esteem Bipolar Disorders Life Transitions Personality Disorders ADHD Grief and Loss  *[facility] is proud to offer specialized treatment services on an outpatient level including, EMDR, Marriage/Family Counseling and Christian Counseling  We currently accept all major insurance, including: Medicaid,Uninsured, [employer], [address], [place], [employer], [place], [employer], [employer], [employer], [employer] Phone:  [phone] Website:  [email or web address]           How to get started with virtual therapy covered by Medicaid: Medicaid-covered members can call our Admissions Team 24/7 or fill out  our online form to learn about their plan's specific benefits and get started with treatment. Once we verify your Medicaid benefits, our Clinical Team will conduct a thorough mental health assessment to create your personalized treatment plan. Medicaid members can get started with their personalized treatment plan (which includes curated groups, individual therapy, and family therapy) in as little as 24 hours. Call [phone]  What we Treat:  Anxiety Treatment for Teens and Adults  Our therapists specialize in cognitive behavioral therapy, a leading anxiety treatment, to provide evidence-based mental healthcare for teens and adults dealing with anxiety disorders. Fill out the short form below or call us directly to start healing from anxiety today with [facility].  Depression Treatment for Teens and Adults Depression affects millions of people worldwide, but healing is possible with evidence-based treatment.   Trauma Treatment for Teens and Adults After surviving trauma, building connections and receiving trauma-informed care are critical for long-lasting healing. That's why [facility] offers trauma-informed therapy in individual and group sessions.   Self-Harm Treatment for Teens and Adults Self-harm is often linked to serious mental health issues, which is why understanding the root of self-harm is key to long-lasting recovery.   Suicidal ideation Passive suicidal ideation, chronic suicidal ideation, previous suicide attempt  Substance Use Disorders Treatment for Teens and Adults:   Alcohol, marijuana, prescription drugs, opioids, amphetamines, cocaine, inhalants, hallucinogens, nicotine Understanding the mental health roots of substance use disorders (SUD) is key to long-lasting recovery.

## 2023-06-13 ENCOUNTER — Other Ambulatory Visit: Payer: Self-pay

## 2023-06-13 ENCOUNTER — Emergency Department (HOSPITAL_COMMUNITY)
Admission: EM | Admit: 2023-06-13 | Discharge: 2023-06-13 | Disposition: A | Discharge status: Home / Self Care | Attending: Emergency Medicine | Admitting: Emergency Medicine

## 2023-06-13 DIAGNOSIS — M25551 Pain in right hip: Secondary | ICD-10-CM | POA: Insufficient documentation

## 2023-06-13 DIAGNOSIS — R1084 Generalized abdominal pain: Secondary | ICD-10-CM

## 2023-06-13 DIAGNOSIS — K5900 Constipation, unspecified: Secondary | ICD-10-CM

## 2023-06-13 DIAGNOSIS — R1013 Epigastric pain: Secondary | ICD-10-CM | POA: Insufficient documentation

## 2023-06-13 DIAGNOSIS — R109 Unspecified abdominal pain: Secondary | ICD-10-CM | POA: Diagnosis present

## 2023-06-13 LAB — CBC WITH DIFFERENTIAL/PLATELET
Abs Immature Granulocytes: 0.04 10*3/uL (ref 0.00–0.07)
Basophils Absolute: 0 10*3/uL (ref 0.0–0.1)
Basophils Relative: 0 %
Eosinophils Absolute: 0.1 10*3/uL (ref 0.0–0.5)
Eosinophils Relative: 1 %
HCT: 42.5 % (ref 36.0–46.0)
Hemoglobin: 14.1 g/dL (ref 12.0–15.0)
Immature Granulocytes: 1 %
Lymphocytes Relative: 30 %
Lymphs Abs: 2 10*3/uL (ref 0.7–4.0)
MCH: 31.8 pg (ref 26.0–34.0)
MCHC: 33.2 g/dL (ref 30.0–36.0)
MCV: 95.9 fL (ref 80.0–100.0)
Monocytes Absolute: 0.4 10*3/uL (ref 0.1–1.0)
Monocytes Relative: 6 %
Neutro Abs: 4.2 10*3/uL (ref 1.7–7.7)
Neutrophils Relative %: 62 %
Platelets: 248 10*3/uL (ref 150–400)
RBC: 4.43 MIL/uL (ref 3.87–5.11)
RDW: 13.2 % (ref 11.5–15.5)
WBC: 6.8 10*3/uL (ref 4.0–10.5)
nRBC: 0 % (ref 0.0–0.2)

## 2023-06-13 LAB — COMPREHENSIVE METABOLIC PANEL WITH GFR
ALT: 22 U/L (ref 0–44)
AST: 19 U/L (ref 15–41)
Albumin: 3.9 g/dL (ref 3.5–5.0)
Alkaline Phosphatase: 40 U/L (ref 38–126)
Anion gap: 5 (ref 5–15)
BUN: 9 mg/dL (ref 6–20)
CO2: 26 mmol/L (ref 22–32)
Calcium: 9 mg/dL (ref 8.9–10.3)
Chloride: 104 mmol/L (ref 98–111)
Creatinine, Ser: 0.68 mg/dL (ref 0.44–1.00)
GFR, Estimated: >60 mL/min (ref >60)
Glucose, Bld: 87 mg/dL (ref 70–99)
Potassium: 4.4 mmol/L (ref 3.5–5.1)
Sodium: 135 mmol/L (ref 135–145)
Total Bilirubin: 0.5 mg/dL (ref 0.0–1.2)
Total Protein: 7.1 g/dL (ref 6.5–8.1)

## 2023-06-13 LAB — URINALYSIS, ROUTINE W REFLEX MICROSCOPIC
Bilirubin Urine: NEGATIVE
Glucose, UA: NEGATIVE mg/dL
Hgb urine dipstick: NEGATIVE
Ketones, ur: NEGATIVE mg/dL
Leukocytes,Ua: NEGATIVE
Nitrite: NEGATIVE
Protein, ur: NEGATIVE mg/dL
Specific Gravity, Urine: 1.006 (ref 1.005–1.030)
pH: 6 (ref 5.0–8.0)

## 2023-06-13 LAB — HCG, QUANTITATIVE, PREGNANCY: hCG, Beta Chain, Quant, S: <1 m[IU]/mL (ref <5)

## 2023-06-13 LAB — LIPASE, BLOOD: Lipase: 33 U/L (ref 11–51)

## 2023-06-13 NOTE — ED Provider Notes (Signed)
 [place] EMERGENCY DEPARTMENT AT [facility] Provider Note   CSN: [id number] Arrival date & time: 06/13/23  7829     History  No chief complaint on file.   Kelli Hoover is a 33 y.o. female who presents to the emergency department with a chief complaint of constipation and leg pain.  Patient states she has been constipated for 5 days.  Patient has a past medical history significant for panic attacks, depression, obsessive-compulsive disorder, acute appendicitis, gallbladder mass.  Patient states that she recently saw her primary care provider who increased some of her psychiatric medications, but states that her constipation was going on before this.  States that she has been constipated in the past but never to this level.  Patient states she has had 1 episode of vomiting this morning, denies any hematemesis, denies hematochezia.  Patient states that at home she has taken Metamucil for the last 5 days, taking 3 to 4 tablets/day.  She states that she started Dulcolax last night where she took 3 tablets.  She states that in the past she has had relief from constipation with taking Dulcolax, but only had 1 minimal episode of diarrhea this morning at 8 AM. Patient states her right hip has been in pain for 1 to 2 years.  Denies trauma/injury.  Denies issues with ambulation.  Patient states it is just a constant pain feels like it is radiating down the back of her butt cheek.  Denies weakness or changes in sensation.  HPI     Home Medications Prior to Admission medications   Medication Sig Start Date End Date Taking? Authorizing Provider  cyclobenzaprine  (FLEXERIL ) 5 MG tablet Take 1 tablet (5 mg total) by mouth 3 (three) times daily as needed for muscle spasms. 03/30/23   St Annice Kim, NP  FLUoxetine  (PROZAC ) 20 MG capsule Take 1 capsule (20 mg total) by mouth daily. 06/11/23   Rankin, Shuvon B, NP  hydrOXYzine  (ATARAX ) 10 MG tablet Take 10-20 mg (1-2 tablets) three times  daily as needed for anxiety 06/11/23   Rankin, Shuvon B, NP  QUEtiapine (SEROQUEL XR) 50 MG TB24 24 hr tablet Take 1 tablet (50 mg total) by mouth at bedtime. 06/11/23   Rankin, Shuvon B, NP  QUEtiapine (SEROQUEL) 25 MG tablet Take 1 tablet (25 mg total) by mouth daily as needed (panic symptoms, agitation). 06/11/23   Rankin, Shuvon B, NP      Allergies    Erythromycin    Review of Systems   Review of Systems  Constitutional:  Negative for activity change, appetite change, chills and fever.  Eyes:  Negative for visual disturbance.  Respiratory:  Negative for chest tightness and shortness of breath.   Cardiovascular:  Negative for chest pain.  Gastrointestinal:  Positive for abdominal pain and constipation. Negative for abdominal distention, diarrhea, nausea and vomiting.  Genitourinary:  Negative for dysuria and hematuria.  Skin:  Negative for rash and wound.    Physical Exam Updated Vital Signs There were no vitals taken for this visit. Physical Exam Vitals and nursing note reviewed.  Constitutional:      General: She is not in acute distress.    Appearance: Normal appearance. She is not ill-appearing, toxic-appearing or diaphoretic.  HENT:     Head: Normocephalic and atraumatic.  Eyes:     General: No scleral icterus.    Extraocular Movements: Extraocular movements intact.  Cardiovascular:     Rate and Rhythm: Normal rate and regular rhythm.  Pulmonary:  Effort: Pulmonary effort is normal. No respiratory distress.     Breath sounds: Normal breath sounds. No wheezing, rhonchi or rales.  Abdominal:     General: Abdomen is flat. There is no distension.     Palpations: Abdomen is soft.     Tenderness: There is abdominal tenderness (mild epigastric tenderness). There is no right CVA tenderness, left CVA tenderness, guarding or rebound.  Musculoskeletal:        General: Tenderness (Mild tendnerness of R hip) present. Normal range of motion.     Right lower leg: No edema.      Left lower leg: No edema.  Neurological:     Mental Status: She is alert.     ED Results / Procedures / Treatments   Labs (all labs ordered are listed, but only abnormal results are displayed) Labs Reviewed - No data to display  EKG None  Radiology No results found.  Procedures Procedures    Medications Ordered in ED Medications - No data to display  ED Course/ Medical Decision Making/ A&P    Patient presents to the ED for concern of constipation/abdominal pain, R hip pain, this involves an extensive number of treatment options, and is a complaint that carries with it a high risk of complications and morbidity.  The differential diagnosis includes small bowel obstruction, cholecystitis, appendicitis, diverticulitis, pancreatitis, septic arthritis, osteoarthritis, trauma/injury   Co morbidities that complicate the patient evaluation  None   Lab Tests:  I Ordered, and personally interpreted labs.  The pertinent results include: Work unremarkable   Medicines ordered and prescription drug management:  I have reviewed the patients home medicines and have made adjustments as needed   Test Considered:  CT abdomen pelvis: Declined due to patient stable vitals, lab work unremarkable, patient not in acute pain at time of reassessment, no vomiting in emergency department today, history of constipation   Critical Interventions:  None   Problem List / ED Course:  Abdominal pain, constipation, right hip pain Patient states that she has not any bowel movement for 5 days, believes she is constipated Right hip pain has been ongoing for 1 to 2 years, patient has not seen primary care provider or orthopedics about it.  Patient denies trauma/injury.  Educated that we could get an x-ray today but that with no trauma and injury the likelihood of anything being found acute is unlikely.  Patient understands and states she will follow-up with PCP or Ortho.  Patient ambulatory  without assistance at baseline. Patient educated on outpatient counter anti-inflammatory treatment. Lab workup today largely unremarkable for abdominal pain, declines CT scan at this time due to this along with stable vitals and only generalized abdominal pain on physical exam Vital signs stable Patient educated on outpatient constipation medicines including MiraLAX and magnesium citrate Return precautions given Patient discharged  Reevaluation:  After the interventions noted above, I reevaluated the patient and found that they have :stayed the same   Social Determinants of Health:  None   Dispostion:  After consideration of the diagnostic results and the patients response to treatment, I feel that the patent would benefit from urgent outpatient therapy for constipation.  Patient instructed to follow-up with primary care provider as well as scheduled.  Click here for ABCD2, HEART and other calculatorsREFRESH Note before signing :1}                              Medical Decision Making  Final Clinical Impression(s) / ED Diagnoses Final diagnoses:  None    Rx / DC Orders ED Discharge Orders     None         Susanne Epps 06/13/23 1431    Early Glisson, MD 06/14/23 [phone]

## 2023-06-13 NOTE — ED Triage Notes (Signed)
 Pt c/o abd pain and inability to have a BM x 5 days. States she has tried her regular metamucil without relief, as well as trying dulcolax without relief. Also states she would like her R hip checked out today as well bc "It makes a popping sound"

## 2023-06-13 NOTE — Discharge Instructions (Addendum)
 It was a pleasure to take care of you today.  Today we evaluated your abdominal pain as well as your constipation.  Based on your history, physical exam, and labs today the most likley diagnosis is constipation.  Please take over-the-counter MiraLAX 3 times a day until you are having regular bowel movements, as well as drink 1 bottle of magnesium citrate today. These medicines are over-the-counter and can be purchased at a pharmacy.  If you begin experiencing the following symptoms included but not limited to vomiting, fever, chills, blood in vomit, severe abdominal pain, chest pain, shortness of breath, please return to the emergency department.

## 2023-07-02 ENCOUNTER — Telehealth (HOSPITAL_COMMUNITY): Admitting: Registered Nurse

## 2023-07-02 ENCOUNTER — Telehealth (HOSPITAL_COMMUNITY): Payer: Self-pay

## 2023-07-02 ENCOUNTER — Other Ambulatory Visit (HOSPITAL_COMMUNITY): Payer: Self-pay | Admitting: Registered Nurse

## 2023-07-02 DIAGNOSIS — F422 Mixed obsessional thoughts and acts: Secondary | ICD-10-CM

## 2023-07-02 DIAGNOSIS — F3130 Bipolar disorder, current episode depressed, mild or moderate severity, unspecified: Secondary | ICD-10-CM

## 2023-07-02 DIAGNOSIS — F411 Generalized anxiety disorder: Secondary | ICD-10-CM

## 2023-07-02 MED ORDER — HYDROXYZINE HCL 10 MG PO TABS: ORAL_TABLET | ORAL | 0 refills | Status: DC

## 2023-07-02 MED ORDER — FLUOXETINE HCL 20 MG PO CAPS
20.0000 mg | ORAL_CAPSULE | Freq: Every day | ORAL | 0 refills | Status: DC
Start: 2023-07-02 — End: 2023-07-16

## 2023-07-02 MED ORDER — QUETIAPINE FUMARATE 25 MG PO TABS: 25.0000 mg | ORAL_TABLET | Freq: Every day | ORAL | 0 refills | Status: DC | PRN

## 2023-07-02 MED ORDER — QUETIAPINE FUMARATE ER 50 MG PO TB24: 50.0000 mg | ORAL_TABLET | Freq: Every day | ORAL | 0 refills | Status: DC

## 2023-07-02 NOTE — Telephone Encounter (Signed)
 Pt called in requesting a refill on pt's hydrOXYzine  (ATARAX ) 10 MG tablet sent to [facility] in [place]. Please advise pt scheduled 07/16/23

## 2023-07-02 NOTE — Telephone Encounter (Signed)
Pt aware.

## 2023-07-13 ENCOUNTER — Ambulatory Visit (HOSPITAL_COMMUNITY): Admitting: Clinical

## 2023-07-16 ENCOUNTER — Telehealth (HOSPITAL_COMMUNITY): Admitting: Registered Nurse

## 2023-07-16 ENCOUNTER — Encounter (HOSPITAL_COMMUNITY): Payer: Self-pay | Admitting: Registered Nurse

## 2023-07-16 DIAGNOSIS — F411 Generalized anxiety disorder: Secondary | ICD-10-CM | POA: Diagnosis not present

## 2023-07-16 DIAGNOSIS — F422 Mixed obsessional thoughts and acts: Secondary | ICD-10-CM

## 2023-07-16 DIAGNOSIS — F3130 Bipolar disorder, current episode depressed, mild or moderate severity, unspecified: Secondary | ICD-10-CM

## 2023-07-16 MED ORDER — FLUOXETINE HCL 20 MG PO CAPS: 20.0000 mg | ORAL_CAPSULE | Freq: Every day | ORAL | 1 refills | Status: AC

## 2023-07-16 MED ORDER — BUSPIRONE HCL 5 MG PO TABS: 5.0000 mg | ORAL_TABLET | Freq: Two times a day (BID) | ORAL | 1 refills | Status: AC

## 2023-07-16 MED ORDER — QUETIAPINE FUMARATE ER 50 MG PO TB24: 50.0000 mg | ORAL_TABLET | Freq: Every day | ORAL | 1 refills | Status: AC

## 2023-07-16 MED ORDER — HYDROXYZINE HCL 25 MG PO TABS: 25.0000 mg | ORAL_TABLET | Freq: Three times a day (TID) | ORAL | 1 refills | Status: AC | PRN

## 2023-07-16 NOTE — Patient Instructions (Signed)

## 2023-07-16 NOTE — Progress Notes (Signed)
 [facility] MD/PA/NP OP Progress Note  07/16/2023 12:15 PM Kelli Hoover  MRN:  [id number]  Virtual Visit via Video Note  I connected with Kelli Hoover on 07/16/23 at 11:30 AM EDT by a video enabled telemedicine application and verified that I am speaking with the correct person using two identifiers.  Location: Patient: In car Provider: Home office   I discussed the limitations of evaluation and management by telemedicine and the availability of in person appointments. The patient expressed understanding and agreed to proceed.  I discussed the assessment and treatment plan with the patient. The patient was provided an opportunity to ask questions and all were answered. The patient agreed with the plan and demonstrated an understanding of the instructions.   The patient was advised to call back or seek an in-person evaluation if the symptoms worsen or if the condition fails to improve as anticipated.  I provided 30 minutes of non-face-to-face time during this encounter.   Kelli Ruder, NP   Chief Complaint:  Chief Complaint  Patient presents with   Follow-up    Medication management   HPI: Kelli Hoover 33 y.o. female presents today for medication management follow up.  She is seen via virtual video visit by this provider, and chart reviewed on 07/16/23.  Her psychiatric history is significant for major depression, general anxiety, bipolar disorder, and OCD.  Her mental health is currently managed with Vistaril  10-20 mg Tid prn, Seroquel  25 mg Q day, Seroquel  XR 50 mg Q hs, .and Prozac  20 mg daily.  She reports there has been improvement in OCD, mood swings, impulsive behavior but anxiety has improve a little.  Reports she is having to take Vistaril  multiple times to calm down and would like for it to be better controled.    Today she denies fluctuations in mood, abnormal movement, suicidal/self-harm/homicidal ideation, psychosis, and paranoia.    Recommended the following: Discontinue  Seroquel  25 mg daily, Increased Vistaril  25 mg tid prn, Continue Prozac  20 mg daily, Seroquel  XR Q hs, and added Buspar  5 mg bid.   She is Informed of side effect/efficacy profile on Buspar .  She is also informed that usually takes a couple of weeks before notable improvements are seen.  She voices understanding/agreement with information/recommendations being given to her today.    Visit Diagnosis:    ICD-10-CM   1. Bipolar disorder, most recent episode depressed (HCC)  F31.30 QUEtiapine  (SEROQUEL  XR) 50 MG TB24 24 hr tablet    FLUoxetine  (PROZAC ) 20 MG capsule    2. GAD (generalized anxiety disorder)  F41.1 FLUoxetine  (PROZAC ) 20 MG capsule    hydrOXYzine  (ATARAX ) 25 MG tablet    busPIRone  (BUSPAR ) 5 MG tablet    3. Mixed obsessional thoughts and acts  F42.2 FLUoxetine  (PROZAC ) 20 MG capsule      Past Psychiatric History: Major depression, general anxiety, bipolar, OCD, and self injures behavior during adolescence   Past Medical History:  Past Medical History:  Diagnosis Date   Depression    Migraine    Miscarriage    Obsessive-compulsive disorder    Panic attack     Past Surgical History:  Procedure Laterality Date   APPENDECTOMY     DILATION AND CURETTAGE OF UTERUS     INDUCED ABORTION     LAPAROSCOPIC APPENDECTOMY N/A 07/13/2018   Procedure: LAPAROSCOPIC APPENDECTOMY;  Surgeon: Mavis Anes, MD;  Location: [facility];  Service: General;  Laterality: N/A;    Family Psychiatric History: See below in family history  Family History:  Family History  Problem Relation Age of Onset   Hypertension Mother    Anxiety disorder Mother    Thyroid  disease Mother    Anxiety disorder Sister    Bipolar disorder Maternal Aunt    Ulcers Paternal Aunt    Hypertension Maternal Grandfather    Anxiety disorder Maternal Grandmother    Colon cancer Maternal Grandmother        age greater than 1   Cancer Other    Hypertension Other    Bipolar disorder Daughter     Social History:   Social History   Socioeconomic History   Marital status: Single    Spouse name: Not on file   Number of children: 2   Years of education: GED   Highest education level: GED or equivalent  Occupational History   Not on file  Tobacco Use   Smoking status: Former    Current packs/day: 1.00    Average packs/day: 1 pack/day for 7.0 years (7.0 ttl pk-yrs)    Types: Cigarettes   Smokeless tobacco: Never  Vaping Use   Vaping status: Every Day   Substances: Nicotine   Substance and Sexual Activity   Alcohol use: Yes    Comment: Occassional use   Drug use: Not Currently    Types: Marijuana, Heroin   Sexual activity: Yes    Birth control/protection: None  Other Topics Concern   Not on file  Social History Narrative   Not on file   Social Drivers of Health   Financial Resource Strain: Not on file  Food Insecurity: No Food Insecurity (06/12/2023)   Received from [facility]   Hunger Vital Sign    Within the past 12 months, you worried that your food would run out before you got the money to buy more.: Never true    Within the past 12 months, the food you bought just didn't last and you didn't have money to get more.: Never true  Transportation Needs: No Transportation Needs (06/12/2023)   Received from [facility]   PRAPARE - Transportation    Lack of Transportation (Medical): No    Lack of Transportation (Non-Medical): No  Physical Activity: Not on file  Stress: Not on file  Social Connections: Unknown (11/18/2022)   Received from [facility]   Social Network    Social Network: Not on file    Allergies:  Allergies  Allergen Reactions   Erythromycin Shortness Of Breath and Palpitations    Metabolic Disorder Labs: Recent Results (from the past 2160 hours)  Urinalysis, Routine w reflex microscopic -Urine, Clean Catch     Status: Abnormal   Collection Time: 06/13/23 10:15 AM  Result Value Ref Range   Color, Urine STRAW (A) YELLOW   APPearance CLEAR CLEAR    Specific Gravity, Urine 1.006 1.005 - 1.030   pH 6.0 5.0 - 8.0   Glucose, UA NEGATIVE NEGATIVE mg/dL   Hgb urine dipstick NEGATIVE NEGATIVE   Bilirubin Urine NEGATIVE NEGATIVE   Ketones, ur NEGATIVE NEGATIVE mg/dL   Protein, ur NEGATIVE NEGATIVE mg/dL   Nitrite NEGATIVE NEGATIVE   Leukocytes,Ua NEGATIVE NEGATIVE    Comment: Performed at [facility], [address], [place], [place] [address]  CBC with Differential     Status: None   Collection Time: 06/13/23 10:39 AM  Result Value Ref Range   WBC 6.8 4.0 - 10.5 K/uL   RBC 4.43 3.87 - 5.11 MIL/uL   Hemoglobin 14.1 12.0 - 15.0 g/dL  HCT 42.5 36.0 - 46.0 %   MCV 95.9 80.0 - 100.0 fL   MCH 31.8 26.0 - 34.0 pg   MCHC 33.2 30.0 - 36.0 g/dL   RDW 86.7 88.4 - 84.4 %   Platelets 248 150 - 400 K/uL   nRBC 0.0 0.0 - 0.2 %   Neutrophils Relative % 62 %   Neutro Abs 4.2 1.7 - 7.7 K/uL   Lymphocytes Relative 30 %   Lymphs Abs 2.0 0.7 - 4.0 K/uL   Monocytes Relative 6 %   Monocytes Absolute 0.4 0.1 - 1.0 K/uL   Eosinophils Relative 1 %   Eosinophils Absolute 0.1 0.0 - 0.5 K/uL   Basophils Relative 0 %   Basophils Absolute 0.0 0.0 - 0.1 K/uL   Immature Granulocytes 1 %   Abs Immature Granulocytes 0.04 0.00 - 0.07 K/uL    Comment: Performed at [facility], [address] [place], [place] [address]  Comprehensive metabolic panel     Status: None   Collection Time: 06/13/23 10:39 AM  Result Value Ref Range   Sodium 135 135 - 145 mmol/L   Potassium 4.4 3.5 - 5.1 mmol/L   Chloride 104 98 - 111 mmol/L   CO2 26 22 - 32 mmol/L   Glucose, Bld 87 70 - 99 mg/dL    Comment: Glucose reference range applies only to samples taken after fasting for at least 8 hours.   BUN 9 6 - 20 mg/dL   Creatinine, Ser 9.31 0.44 - 1.00 mg/dL   Calcium 9.0 8.9 - 89.6 mg/dL   Total Protein 7.1 6.5 - 8.1 g/dL   Albumin 3.9 3.5 - 5.0 g/dL   AST 19 15 - 41 U/L   ALT 22 0 - 44 U/L   Alkaline Phosphatase 40 38 - 126 U/L   Total Bilirubin 0.5 0.0 - 1.2 mg/dL   GFR,  Estimated >39 >39 mL/min    Comment: (NOTE) Calculated using the CKD-EPI Creatinine Equation (2021)    Anion gap 5 5 - 15    Comment: Performed at [facility], [address], [place], [place] [address]  hCG, quantitative, pregnancy     Status: None   Collection Time: 06/13/23 10:39 AM  Result Value Ref Range   hCG, Beta Chain, Quant, S <1 <5 mIU/mL    Comment:          GEST. AGE      CONC.  (mIU/mL)   <=1 WEEK        5 - 50     2 WEEKS       50 - 500     3 WEEKS       100 - 10,000     4 WEEKS     1,000 - 30,000     5 WEEKS     3,500 - 115,000   6-8 WEEKS     12,000 - 270,000    12 WEEKS     15,000 - 220,000        FEMALE AND NON-PREGNANT FEMALE:     LESS THAN 5 mIU/mL Performed at [facility], [address] [place], [place] [address]   Lipase, blood     Status: None   Collection Time: 06/13/23 10:39 AM  Result Value Ref Range   Lipase 33 11 - 51 U/L    Comment: Performed at [facility], [address] [place], [place] [address]    Lab Results  Component Value Date   CHOL 196 03/30/2023  TRIG 87 03/30/2023   HDL 53 03/30/2023   CHOLHDL 3.7 03/30/2023   LDLCALC 127 (H) 03/30/2023   Lab Results  Component Value Date   TSH 1.070 03/30/2023    Current Medications: Current Outpatient Medications  Medication Sig Dispense Refill   busPIRone  (BUSPAR ) 5 MG tablet Take 1 tablet (5 mg total) by mouth 2 (two) times daily. 60 tablet 1   cyclobenzaprine  (FLEXERIL ) 5 MG tablet Take 1 tablet (5 mg total) by mouth 3 (three) times daily as needed for muscle spasms. 30 tablet 0   FLUoxetine  (PROZAC ) 20 MG capsule Take 1 capsule (20 mg total) by mouth daily. 30 capsule 1   hydrOXYzine  (ATARAX ) 25 MG tablet Take 1 tablet (25 mg total) by mouth 3 (three) times daily as needed for anxiety. Take 10-20 mg (1-2 tablets) three times daily as needed for anxiety 60 tablet 1   QUEtiapine  (SEROQUEL  XR) 50 MG TB24 24 hr tablet Take 1 tablet (50 mg total) by mouth at bedtime. 30 tablet 1   No  current facility-administered medications for this visit.     Musculoskeletal: Strength & Muscle Tone: Unable to assess via virtual visit Gait & Station: Unable to assess via virtual visit Patient leans: N/A  Psychiatric Specialty Exam: Review of Systems  Constitutional:        No other complaints voiced  Psychiatric/Behavioral:  Positive for agitation (Improved) and dysphoric mood (Improved). Negative for hallucinations, self-injury, sleep disturbance and suicidal ideas. The patient is nervous/anxious (Improving but could be better).   All other systems reviewed and are negative.   unknown if currently breastfeeding.There is no height or weight on file to calculate BMI.  General Appearance: Casual  Eye Contact:  Good  Speech:  Clear and Coherent and Normal Rate  Volume:  Normal  Mood:  Euthymic  Affect:  Congruent  Thought Process:  Coherent, Goal Directed, and Descriptions of Associations: Intact  Orientation:  Full (Time, Place, and Person)  Thought Content: Logical   Suicidal Thoughts:  No  Homicidal Thoughts:  No  Memory:  Immediate;   Good Recent;   Good Remote;   Good  Judgement:  Intact  Insight:  Present  Psychomotor Activity:  Normal  Concentration:  Concentration: Good and Attention Span: Good  Recall:  Good  Fund of Knowledge: Good  Language: Good  Akathisia:  No  Handed:  Right  AIMS (if indicated): not done  Assets:  Communication Skills Desire for Improvement Financial Resources/Insurance Housing Leisure Time Physical Health Social Support Transportation  ADL's:  Intact  Cognition: WNL  Sleep:  Good   Screenings: Geneticist, molecular Office Visit from 06/11/2023 in [facility] [place]  AIMS Total Score 0   GAD-7    Flowsheet Row Office Visit from 06/11/2023 in [facility] [place] Office Visit from 03/30/2023 in [facility] Office Visit from  02/07/2015 in [facility]  Total GAD-7 Score 16 4 6    PHQ2-9    Flowsheet Row Office Visit from 06/11/2023 in [facility] [place] Office Visit from 03/30/2023 in [facility] Office Visit from 02/07/2015 in [facility] Office Visit from 07/20/2014 in [facility]  PHQ-2 Total Score 2 1 0 0  PHQ-9 Total Score 10 4 -- --   Flowsheet Row ED from 06/13/2023 in [facility]  at [facility] Office Visit from 06/11/2023 in [facility] [place] ED from 09/27/2020 in [facility] Department at [facility]  C-SSRS RISK CATEGORY No Risk No Risk No Risk     Assessment and Plan:  Assessment: Patient seen and examined as noted above. Summary: Today Kelli Hoover appears to be doing well.      She reports current medications regimen is effectively managing her mental health without any adverse reaction except for anxiety which has improved but feels it could be better.  Reports improvement in OCD symptoms, mood, depression, and sleep.  Reports eating without difficulty. She denies suicidal/self-harm/homicidal ideation, psychosis, paranoia, fluctuations in mood, and abnormal movements.    During visit she is dressed appropriate for age and weather.  She is seated comfortably in view of camera with no noted distress.  She is alert/oriented x 4, calm/cooperative and mood is congruent with affect.  She spoke in a clear tone at moderate volume, and normal pace, with good eye contact.  Her thought process is coherent, relevant, and there is no indication that she is currently responding to internal/external stimuli or experiencing delusional thought content.   1. Bipolar disorder, most recent episode depressed (HCC) (Primary) - QUEtiapine  (SEROQUEL  XR) 50 MG TB24 24 hr tablet;  Take 1 tablet (50 mg total) by mouth at bedtime.  Dispense: 30 tablet; Refill: 1 - FLUoxetine  (PROZAC ) 20 MG capsule; Take 1 capsule (20 mg total) by mouth daily.  Dispense: 30 capsule; Refill: 1  2. GAD (generalized anxiety disorder) - FLUoxetine  (PROZAC ) 20 MG capsule; Take 1 capsule (20 mg total) by mouth daily.  Dispense: 30 capsule; Refill: 1 - hydrOXYzine  (ATARAX ) 25 MG tablet; Take 1 tablet (25 mg total) by mouth 3 (three) times daily as needed for anxiety. Take 10-20 mg (1-2 tablets) three times daily as needed for anxiety  Dispense: 60 tablet; Refill: 1 - busPIRone  (BUSPAR ) 5 MG tablet; Take 1 tablet (5 mg total) by mouth 2 (two) times daily.  Dispense: 60 tablet; Refill: 1  3. Mixed obsessional thoughts and acts - FLUoxetine  (PROZAC ) 20 MG capsule; Take 1 capsule (20 mg total) by mouth daily.  Dispense: 30 capsule; Refill: 1  Plan: Medications: Meds ordered this encounter  Medications   QUEtiapine  (SEROQUEL  XR) 50 MG TB24 24 hr tablet    Sig: Take 1 tablet (50 mg total) by mouth at bedtime.    Dispense:  30 tablet    Refill:  1    Supervising Provider:   ARFEEN, SYED T [[id number]]   FLUoxetine  (PROZAC ) 20 MG capsule    Sig: Take 1 capsule (20 mg total) by mouth daily.    Dispense:  30 capsule    Refill:  1    Supervising Provider:   ARFEEN, SYED T [[id number]]   hydrOXYzine  (ATARAX ) 25 MG tablet    Sig: Take 1 tablet (25 mg total) by mouth 3 (three) times daily as needed for anxiety. Take 10-20 mg (1-2 tablets) three times daily as needed for anxiety    Dispense:  60 tablet    Refill:  1    Supervising Provider:   CURRY PATERSON T [[id number]]   busPIRone  (BUSPAR ) 5 MG tablet    Sig: Take 1 tablet (5 mg total) by mouth 2 (two) times daily.    Dispense:  60 tablet    Refill:  1    Supervising Provider:   CURRY PATERSON DASEN [[id number]]    Labs:  Most  recent labs reviewed 05/2023 - 06/2023.  No orders indicated at this time  Other:  Follow with referred to  counseling/therapy.   Kelli Hoover is  instructed to call [phone], [phone], mobile crisis, or present to the nearest emergency room should she experience any suicidal/homicidal ideation, auditory/visual/hallucinations, or detrimental worsening of her mental health condition.   Kelli Hoover participated in the development of this treatment plan and verbalized her understanding/agreement with plan as listed.  Follow Up: Return in 1 month Call in the interim for any side-effects, decompensation, questions, or problems  Collaboration of Care: Collaboration of Care: Medication Management AEB Medication assessment, adjustment, and refill  Patient/Guardian was advised Release of Information must be obtained prior to any record release in order to collaborate their care with an outside provider. Patient/Guardian was advised if they have not already done so to contact the registration department to sign all necessary forms in order for us  to release information regarding their care.   Consent: Patient/Guardian gives verbal consent for treatment and assignment of benefits for services provided during this visit. Patient/Guardian expressed understanding and agreed to proceed.    Kelli Beaston, NP 07/16/2023, 12:15 PM

## 2023-09-18 ENCOUNTER — Encounter (HOSPITAL_COMMUNITY): Payer: Self-pay | Admitting: *Deleted

## 2023-09-18 ENCOUNTER — Emergency Department (HOSPITAL_COMMUNITY)
Admission: EM | Admit: 2023-09-18 | Discharge: 2023-09-18 | Discharge status: Against Medical Advice | Attending: Emergency Medicine | Admitting: Emergency Medicine

## 2023-09-18 ENCOUNTER — Other Ambulatory Visit: Payer: Self-pay

## 2023-09-18 ENCOUNTER — Emergency Department (HOSPITAL_COMMUNITY)

## 2023-09-18 DIAGNOSIS — O26891 Other specified pregnancy related conditions, first trimester: Secondary | ICD-10-CM | POA: Diagnosis present

## 2023-09-18 DIAGNOSIS — Z5321 Procedure and treatment not carried out due to patient leaving prior to being seen by health care provider: Secondary | ICD-10-CM | POA: Insufficient documentation

## 2023-09-18 DIAGNOSIS — R112 Nausea with vomiting, unspecified: Secondary | ICD-10-CM | POA: Diagnosis not present

## 2023-09-18 DIAGNOSIS — M545 Low back pain, unspecified: Secondary | ICD-10-CM | POA: Insufficient documentation

## 2023-09-18 DIAGNOSIS — Z3A08 8 weeks gestation of pregnancy: Secondary | ICD-10-CM | POA: Insufficient documentation

## 2023-09-18 DIAGNOSIS — R350 Frequency of micturition: Secondary | ICD-10-CM | POA: Diagnosis not present

## 2023-09-18 DIAGNOSIS — O219 Vomiting of pregnancy, unspecified: Secondary | ICD-10-CM

## 2023-09-18 DIAGNOSIS — R1031 Right lower quadrant pain: Secondary | ICD-10-CM | POA: Insufficient documentation

## 2023-09-18 LAB — URINALYSIS, ROUTINE W REFLEX MICROSCOPIC
Bilirubin Urine: NEGATIVE
Glucose, UA: NEGATIVE mg/dL
Hgb urine dipstick: NEGATIVE
Ketones, ur: NEGATIVE mg/dL
Leukocytes,Ua: NEGATIVE
Nitrite: NEGATIVE
Protein, ur: NEGATIVE mg/dL
Specific Gravity, Urine: 1.015 (ref 1.005–1.030)
pH: 6 (ref 5.0–8.0)

## 2023-09-18 LAB — BASIC METABOLIC PANEL WITH GFR
Anion gap: 12 (ref 5–15)
BUN: 11 mg/dL (ref 6–20)
CO2: 22 mmol/L (ref 22–32)
Calcium: 9.4 mg/dL (ref 8.9–10.3)
Chloride: 100 mmol/L (ref 98–111)
Creatinine, Ser: 0.55 mg/dL (ref 0.44–1.00)
GFR, Estimated: >60 mL/min (ref >60)
Glucose, Bld: 97 mg/dL (ref 70–99)
Potassium: 4 mmol/L (ref 3.5–5.1)
Sodium: 134 mmol/L — ABNORMAL LOW (ref 135–145)

## 2023-09-18 LAB — CBC WITH DIFFERENTIAL/PLATELET
Abs Immature Granulocytes: 0.05 K/uL (ref 0.00–0.07)
Basophils Absolute: 0 K/uL (ref 0.0–0.1)
Basophils Relative: 0 %
Eosinophils Absolute: 0.1 K/uL (ref 0.0–0.5)
Eosinophils Relative: 1 %
HCT: 40.5 % (ref 36.0–46.0)
Hemoglobin: 13.3 g/dL (ref 12.0–15.0)
Immature Granulocytes: 1 %
Lymphocytes Relative: 27 %
Lymphs Abs: 2.8 K/uL (ref 0.7–4.0)
MCH: 30.5 pg (ref 26.0–34.0)
MCHC: 32.8 g/dL (ref 30.0–36.0)
MCV: 92.9 fL (ref 80.0–100.0)
Monocytes Absolute: 0.5 K/uL (ref 0.1–1.0)
Monocytes Relative: 5 %
Neutro Abs: 7 K/uL (ref 1.7–7.7)
Neutrophils Relative %: 66 %
Platelets: 266 K/uL (ref 150–400)
RBC: 4.36 MIL/uL (ref 3.87–5.11)
RDW: 13.2 % (ref 11.5–15.5)
WBC: 10.4 K/uL (ref 4.0–10.5)
nRBC: 0 % (ref 0.0–0.2)

## 2023-09-18 LAB — HCG, QUANTITATIVE, PREGNANCY: hCG, Beta Chain, Quant, S: 105613 m[IU]/mL — ABNORMAL HIGH (ref <5)

## 2023-09-18 MED ORDER — METOCLOPRAMIDE HCL 10 MG PO TABS: 10.0000 mg | ORAL_TABLET | Freq: Four times a day (QID) | ORAL | 0 refills | Status: AC

## 2023-09-18 NOTE — ED Notes (Signed)
 Pt walked out of the room talking on the phone and said I'm gone and left without signing.

## 2023-09-18 NOTE — ED Provider Triage Note (Signed)
 Emergency Medicine Provider Triage Evaluation Note  Kelli Hoover , a 33 y.o. female  was evaluated in triage.  Pt complains of abd pain x 2 weeks, R sided back and lower abd, some urinary frequency and discomfort. Recently told she was preg. LMP in July.  Review of Systems  Positive: Pain  Negative: bleeding  Physical Exam  BP 103/76 (BP Location: Right Arm)   Pulse 62   Temp 98.6 F (37 C) (Oral)   Resp 16   Ht 5' (1.524 m)   Wt 60.3 kg   LMP 07/21/2023 (Approximate)   SpO2 100%   BMI 25.97 kg/m  Gen:   Awake, no distress   Resp:  Normal effort  MSK:   Moves extremities without difficulty  Other:    Medical Decision Making  Medically screening exam initiated at 3:21 PM.  Appropriate orders placed.  Kelli Hoover was informed that the remainder of the evaluation will be completed by another provider, this initial triage assessment does not replace that evaluation, and the importance of remaining in the ED until their evaluation is complete.  Lab and US  ob ordered.   Charlyn Sora, MD 09/18/23 [phone]

## 2023-09-18 NOTE — ED Triage Notes (Signed)
 Pt with rt lower abd and rt lower back pain, recent dx'd with UTI.  Denies pain with urination but still continued frequency.  + N/V. Pt also recently found out she is pregnant.

## 2023-09-18 NOTE — Discharge Instructions (Addendum)
 Left before completing your evaluation but you are having nausea related to pregnancy.  Fortunately your blood work and ultrasound were reassuring.  Follow-up with OB/GYN.  I prescribed Reglan  for her nausea and vomiting.  You can also take over-the-counter Unisom which is needed for doxylamine succinate.  Take half a tablet at bedtime up to a whole tablet at bedtime.  Avoid taking this during the day if you need to drive or work because it can make you very sleepy.

## 2023-09-18 NOTE — ED Provider Notes (Addendum)
 Patient left prior to my evaluation, initially patient was in ultrasound, when I went back to evaluate her I was informed by nurse that she had came out and told her she was leaving.  I called cell phone several times without results, I called her phone and spoke to her mother Verneita and asked that she call back so I could give her the results.  Fortunately she had a normal pelvic ultrasound and normal labs.  No UTI.  She was not evaluated by me in the ER.   I was able to have the patient call me back, we discussed that her labs and ultrasound were reassuring and to follow-up with OB.  States she primary came in because she did have a lot of nausea and did something for this.  I have sent a prescription for Reglan .  Also advised OTC Doximity and succinate.  She has access to MyChart so I wrote these instructions on her discharge summary and she was advised on return precautions.   Suellen Sherran LABOR, PA-C 09/18/23 1716    Suellen Sherran LABOR, PA-C 09/18/23 1737    Charlyn Sora, MD 09/25/23 [phone]

## 2023-09-21 ENCOUNTER — Other Ambulatory Visit: Payer: Self-pay

## 2023-09-21 ENCOUNTER — Encounter (HOSPITAL_COMMUNITY): Payer: Self-pay | Admitting: Emergency Medicine

## 2023-09-21 ENCOUNTER — Emergency Department (HOSPITAL_COMMUNITY): Admission: EM | Admit: 2023-09-21 | Discharge: 2023-09-21 | Disposition: A | Discharge status: Home / Self Care

## 2023-09-21 DIAGNOSIS — R590 Localized enlarged lymph nodes: Secondary | ICD-10-CM | POA: Diagnosis not present

## 2023-09-21 DIAGNOSIS — G243 Spasmodic torticollis: Secondary | ICD-10-CM | POA: Diagnosis not present

## 2023-09-21 DIAGNOSIS — M542 Cervicalgia: Secondary | ICD-10-CM | POA: Diagnosis present

## 2023-09-21 MED ORDER — DIPHENHYDRAMINE HCL 25 MG PO CAPS
25.0000 mg | ORAL_CAPSULE | Freq: Once | ORAL | Status: AC
  Administered 2023-09-21: 25 mg via ORAL
  Filled 2023-09-21: qty 1

## 2023-09-21 MED ORDER — DIPHENHYDRAMINE HCL 25 MG PO CAPS
25.0000 mg | ORAL_CAPSULE | Freq: Once | ORAL | Status: AC
Start: 2023-09-21 — End: 2023-09-21
  Administered 2023-09-21: 25 mg via ORAL
  Filled 2023-09-21: qty 1

## 2023-09-21 MED ORDER — DIPHENHYDRAMINE HCL 25 MG PO TABS
25.0000 mg | ORAL_TABLET | Freq: Four times a day (QID) | ORAL | 0 refills | Status: AC | PRN
Start: 2023-09-21 — End: ?

## 2023-09-21 MED ORDER — OXYCODONE-ACETAMINOPHEN 5-325 MG PO TABS
1.0000 | ORAL_TABLET | Freq: Once | ORAL | Status: AC
  Administered 2023-09-21: 1 via ORAL
  Filled 2023-09-21: qty 1

## 2023-09-21 MED ORDER — ACETAMINOPHEN 325 MG PO TABS
650.0000 mg | ORAL_TABLET | Freq: Once | ORAL | Status: AC
  Administered 2023-09-21: 650 mg via ORAL
  Filled 2023-09-21: qty 2

## 2023-09-21 NOTE — Discharge Instructions (Addendum)
 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  This can be caused by Reglan .  Recommend you immediately stop your Reglan  use at this time.  And instead take Benadryl  regularly to help with counteracting the side effects of the Reglan .  Taking Tylenol  for pain.  Take Tylenol  (acetominophen)  650mg  every 4-6 hours, as needed for pain or fever. Do not take more than 4,000 mg in a 24-hour period. As this may cause liver damage. While this is rare, if you begin to develop yellowing of the skin or eyes, stop taking and return to ER immediately.  Please return to follow-up with your OB/GYN for further monitoring of your pregnancy as well as with PCP for any persistent symptoms.  Return to the ED for any new or worsening symptoms.

## 2023-09-21 NOTE — ED Provider Notes (Signed)
 [place] EMERGENCY DEPARTMENT AT [facility] Provider Note   CSN: [id number] Arrival date & time: 09/21/23  8177     Patient presents with: Torticollis   Kelli Hoover is a 33 y.o. female.  HPI Patient is a 33 year old female to the ED today for concerns for right sided neck tightness that started very shortly after she took her Reglan  and BuSpar  this morning.  Saying that she consistently has had stretching sensations to her right side of her neck as well as pain to the right side.  Needed to be an allergic reaction and came to the ED.  Noted to be recently pregnant, with recent positive pregnancy test on 8/26 .  Previous medical history of migraines, panic attack, bipolar disorder.  Denies fever, headache, vision changes, dysphagia, odynophagia, chest pain, shortness of breath, throat swelling, facial swelling, urticaria.     Prior to Admission medications   Medication Sig Start Date End Date Taking? Authorizing Provider  diphenhydrAMINE  (BENADRYL ) 25 MG tablet Take 1 tablet (25 mg total) by mouth every 6 (six) hours as needed. 09/21/23  Yes Beola Terrall RAMAN, PA-C  busPIRone  (BUSPAR ) 5 MG tablet Take 1 tablet (5 mg total) by mouth 2 (two) times daily. 07/16/23   Rankin, Shuvon B, NP  cyclobenzaprine  (FLEXERIL ) 5 MG tablet Take 1 tablet (5 mg total) by mouth 3 (three) times daily as needed for muscle spasms. 03/30/23   [facility] [place] Sebastian Pool, NP  FLUoxetine  (PROZAC ) 20 MG capsule Take 1 capsule (20 mg total) by mouth daily. 07/16/23   Rankin, Shuvon B, NP  hydrOXYzine  (ATARAX ) 25 MG tablet Take 1 tablet (25 mg total) by mouth 3 (three) times daily as needed for anxiety. Take 10-20 mg (1-2 tablets) three times daily as needed for anxiety 07/16/23   Rankin, Shuvon B, NP  metoCLOPramide  (REGLAN ) 10 MG tablet Take 1 tablet (10 mg total) by mouth every 6 (six) hours. 09/18/23   Suellen Cantor A, PA-C  QUEtiapine  (SEROQUEL  XR) 50 MG TB24 24 hr tablet Take 1 tablet (50 mg total)  by mouth at bedtime. 07/16/23   Rankin, Shuvon B, NP    Allergies: Erythromycin    Review of Systems  Musculoskeletal:  Positive for neck pain.  All other systems reviewed and are negative.   Updated Vital Signs BP 123/74 (BP Location: Right Arm)   Pulse 76   Temp 99 F (37.2 C) (Oral)   Resp 18   Ht 5' (1.524 m)   Wt 60.3 kg   LMP 07/21/2023 (Approximate)   SpO2 100%   BMI 25.97 kg/m   Physical Exam Vitals and nursing note reviewed.  Constitutional:      General: She is not in acute distress.    Appearance: Normal appearance. She is not ill-appearing or diaphoretic.  HENT:     Head: Normocephalic and atraumatic.     Mouth/Throat:     Mouth: Mucous membranes are moist.     Pharynx: Oropharynx is clear. No oropharyngeal exudate or posterior oropharyngeal erythema.     Comments: Airway clear and open without any signs of dental abscess, peritonsillar abscess.  Uvula midline, no erythema. Eyes:     General: No scleral icterus.       Right eye: No discharge.        Left eye: No discharge.     Extraocular Movements: Extraocular movements intact.     Conjunctiva/sclera: Conjunctivae normal.     Pupils: Pupils are equal, round, and reactive to light.  Neck:     Comments: Patient noted to have decreased ROM when looking to the right, having twitching sensations, moving her head in a circular motion. Cardiovascular:     Rate and Rhythm: Normal rate and regular rhythm.     Pulses: Normal pulses.     Heart sounds: Normal heart sounds. No murmur heard.    No friction rub. No gallop.  Pulmonary:     Effort: Pulmonary effort is normal. No respiratory distress.     Breath sounds: No stridor. No wheezing, rhonchi or rales.  Chest:     Chest wall: No tenderness.  Abdominal:     General: Abdomen is flat. There is no distension.     Palpations: Abdomen is soft.     Tenderness: There is no abdominal tenderness. There is no right CVA tenderness, left CVA tenderness, guarding or  rebound.  Musculoskeletal:        General: No swelling, deformity or signs of injury.     Right lower leg: No edema.     Left lower leg: No edema.  Lymphadenopathy:     Cervical: Cervical adenopathy (Noted to have right sided submandibular lymphadenopathy.) present.  Skin:    General: Skin is warm and dry.     Findings: No bruising, erythema or lesion.  Neurological:     Mental Status: She is alert and oriented to person, place, and time.     Sensory: No sensory deficit.     Motor: No weakness.  Psychiatric:        Mood and Affect: Mood normal.     (all labs ordered are listed, but only abnormal results are displayed) Labs Reviewed - No data to display  EKG: None  Radiology: No results found.  Procedures   Medications Ordered in the ED  diphenhydrAMINE  (BENADRYL ) capsule 25 mg (has no administration in time range)  acetaminophen  (TYLENOL ) tablet 650 mg (has no administration in time range)  oxyCODONE -acetaminophen  (PERCOCET/ROXICET) 5-325 MG per tablet 1 tablet (1 tablet Oral Given 09/21/23 1840)  diphenhydrAMINE  (BENADRYL ) capsule 25 mg (25 mg Oral Given 09/21/23 1840)                                Medical Decision Making Risk Prescription drug management.   This patient is a 33 year old female who presents to the ED for concern of right sided neck stiffness and twitching motions that started shortly after taking her Reglan  and BuSpar  earlier this morning and has been persistent and growing more painful.  No other symptoms at this time.  No signs of anaphylaxis, no signs of infection.  On physical exam, patient is in no acute distress, afebrile, alert and orient x 4, speaking in full sentences, nontachypneic, nontachycardic.  Patient has notable twitching motions with her head throughout the course of the evaluation.  Noting some mild submandibular lymphadenopathy but no signs of Ludwig's angina or peritonsillar abscess or retropharyngeal abscess.  With no other acute  findings.  Provided Benadryl  and Percocet with some relief of symptoms.  Discussed case with attending who recommended consistent anticholinergic medications as well stopping the Reglan  and have her continue to follow-up with OB/GYN and PCP.  Provide additional Benadryl  and will have her continue to follow-up with PCP and OB/GYN.  Patient vital signs have remained stable throughout the course of patient's time in the ED. Low suspicion for any other emergent pathology at this time. I believe this patient is safe  to be discharged. Provided strict return to ER precautions. Patient expressed agreement and understanding of plan. All questions were answered.  Differential diagnoses prior to evaluation: The emergent differential diagnosis includes, but is not limited to,  torticollis, artery dissection, dental abscess, Ludwig angina, retropharyngeal abscess, peritonsillar abscess, allergic reaction, dystonia. This is not an exhaustive differential.   Past Medical History / Co-morbidities / Social History:  migraines, panic attack, bipolar disorder.  Additional history: Chart reviewed. Pertinent results include:   Recently seen in the emergency department on 09/18/2023 for nausea and vomiting in pregnancy.  Noted to have originally left before being evaluated.  Was told of her positive pregnancy results via phone.  Noting a normal pelvic ultrasound that time and normal labs.    Medications: I ordered medication including Benadryl , Tylenol , Percocet.  I have reviewed the patients home medicines and have made adjustments as needed.  Critical Interventions: None  Social Determinants of Health: Recently pregnant  Disposition: After consideration of the diagnostic results and the patients response to treatment, I feel that the patient would benefit from discharge and treatment as above.   emergency department workup does not suggest an emergent condition requiring admission or immediate intervention  beyond what has been performed at this time. The plan is: Follow-up with PCP for persistent symptoms, follow-up with OB/GYN for pregnancy evaluation, return to ED for any new or worsening symptoms. The patient is safe for discharge and has been instructed to return immediately for worsening symptoms, change in symptoms or any other concerns.   Final diagnoses:  Cervical dystonia    ED Discharge Orders          Ordered    diphenhydrAMINE  (BENADRYL ) 25 MG tablet  Every 6 hours PRN        09/21/23 2000               Beola Terrall RAMAN, PA-C 09/21/23 2000    Simon Lavonia SAILOR, MD 09/21/23 2048

## 2023-09-21 NOTE — ED Triage Notes (Signed)
 Pt to the ED with complaints of neck muscle spasms, tongue swelling with it pulling to the right.  Pt states she has taken 3 reglan  and a buspar .

## 2023-09-23 ENCOUNTER — Other Ambulatory Visit (HOSPITAL_COMMUNITY): Payer: Self-pay

## 2023-11-29 ENCOUNTER — Emergency Department (HOSPITAL_COMMUNITY)
Admission: EM | Admit: 2023-11-29 | Discharge: 2023-11-29 | Disposition: A | Discharge status: Home / Self Care | Attending: Emergency Medicine | Admitting: Emergency Medicine

## 2023-11-29 ENCOUNTER — Emergency Department (HOSPITAL_COMMUNITY)

## 2023-11-29 ENCOUNTER — Encounter (HOSPITAL_COMMUNITY): Payer: Self-pay | Admitting: Emergency Medicine

## 2023-11-29 ENCOUNTER — Other Ambulatory Visit: Payer: Self-pay

## 2023-11-29 DIAGNOSIS — S7001XA Contusion of right hip, initial encounter: Secondary | ICD-10-CM

## 2023-11-29 DIAGNOSIS — S329XXA Fracture of unspecified parts of lumbosacral spine and pelvis, initial encounter for closed fracture: Secondary | ICD-10-CM | POA: Diagnosis not present

## 2023-11-29 DIAGNOSIS — Y9351 Activity, roller skating (inline) and skateboarding: Secondary | ICD-10-CM | POA: Diagnosis not present

## 2023-11-29 DIAGNOSIS — S79911A Unspecified injury of right hip, initial encounter: Secondary | ICD-10-CM | POA: Diagnosis present

## 2023-11-29 LAB — URINALYSIS, ROUTINE W REFLEX MICROSCOPIC
Bilirubin Urine: NEGATIVE
Glucose, UA: NEGATIVE mg/dL
Hgb urine dipstick: NEGATIVE
Ketones, ur: NEGATIVE mg/dL
Leukocytes,Ua: NEGATIVE
Nitrite: NEGATIVE
Protein, ur: NEGATIVE mg/dL
Specific Gravity, Urine: 1.035 — ABNORMAL HIGH (ref 1.005–1.030)
pH: 6 (ref 5.0–8.0)

## 2023-11-29 LAB — COMPREHENSIVE METABOLIC PANEL WITH GFR
ALT: 7 U/L (ref 0–44)
AST: 14 U/L — ABNORMAL LOW (ref 15–41)
Albumin: 4.1 g/dL (ref 3.5–5.0)
Alkaline Phosphatase: 53 U/L (ref 38–126)
Anion gap: 10 (ref 5–15)
BUN: 10 mg/dL (ref 6–20)
CO2: 25 mmol/L (ref 22–32)
Calcium: 8.9 mg/dL (ref 8.9–10.3)
Chloride: 104 mmol/L (ref 98–111)
Creatinine, Ser: 0.69 mg/dL (ref 0.44–1.00)
GFR, Estimated: >60 mL/min (ref >60)
Glucose, Bld: 81 mg/dL (ref 70–99)
Potassium: 3.7 mmol/L (ref 3.5–5.1)
Sodium: 139 mmol/L (ref 135–145)
Total Bilirubin: 0.3 mg/dL (ref 0.0–1.2)
Total Protein: 6.8 g/dL (ref 6.5–8.1)

## 2023-11-29 LAB — CBC
HCT: 37.7 % (ref 36.0–46.0)
Hemoglobin: 12.8 g/dL (ref 12.0–15.0)
MCH: 31.3 pg (ref 26.0–34.0)
MCHC: 34 g/dL (ref 30.0–36.0)
MCV: 92.2 fL (ref 80.0–100.0)
Platelets: 233 K/uL (ref 150–400)
RBC: 4.09 MIL/uL (ref 3.87–5.11)
RDW: 12.9 % (ref 11.5–15.5)
WBC: 7.5 K/uL (ref 4.0–10.5)
nRBC: 0 % (ref 0.0–0.2)

## 2023-11-29 LAB — PROTIME-INR
INR: 1.1 (ref 0.8–1.2)
Prothrombin Time: 14.3 s (ref 11.4–15.2)

## 2023-11-29 LAB — LACTIC ACID, PLASMA: Lactic Acid, Venous: 0.6 mmol/L (ref 0.5–1.9)

## 2023-11-29 LAB — ETHANOL: Alcohol, Ethyl (B): <15 mg/dL (ref <15)

## 2023-11-29 LAB — PREGNANCY, URINE: Preg Test, Ur: NEGATIVE

## 2023-11-29 LAB — HCG, QUANTITATIVE, PREGNANCY: hCG, Beta Chain, Quant, S: <1 m[IU]/mL (ref <5)

## 2023-11-29 MED ORDER — ONDANSETRON HCL 4 MG/2ML IJ SOLN
4.0000 mg | Freq: Once | INTRAMUSCULAR | Status: AC
  Administered 2023-11-29: 4 mg via INTRAVENOUS
  Filled 2023-11-29: qty 2

## 2023-11-29 MED ORDER — KETOROLAC TROMETHAMINE 15 MG/ML IJ SOLN
30.0000 mg | Freq: Once | INTRAMUSCULAR | Status: DC
Start: 2023-11-29 — End: 2023-11-29
  Filled 2023-11-29: qty 2

## 2023-11-29 MED ORDER — IOHEXOL 300 MG/ML  SOLN
100.0000 mL | Freq: Once | INTRAMUSCULAR | Status: AC | PRN
  Administered 2023-11-29: 100 mL via INTRAVENOUS

## 2023-11-29 MED ORDER — METHOCARBAMOL 750 MG PO TABS: 750.0000 mg | ORAL_TABLET | Freq: Three times a day (TID) | ORAL | 0 refills | Status: AC

## 2023-11-29 MED ORDER — NAPROXEN 500 MG PO TABS: 500.0000 mg | ORAL_TABLET | Freq: Two times a day (BID) | ORAL | 0 refills | Status: AC

## 2023-11-29 MED ORDER — MORPHINE SULFATE (PF) 4 MG/ML IV SOLN
4.0000 mg | Freq: Once | INTRAVENOUS | Status: AC
  Administered 2023-11-29: 4 mg via INTRAVENOUS
  Filled 2023-11-29: qty 1

## 2023-11-29 MED ORDER — SODIUM CHLORIDE 0.9 % IV BOLUS
1000.0000 mL | Freq: Once | INTRAVENOUS | Status: AC
  Administered 2023-11-29: 1000 mL via INTRAVENOUS

## 2023-11-29 NOTE — ED Triage Notes (Signed)
 Pt complains of lower back pain radiates into right buttock on and off for a year, yesterday when roller skating with son fell and hit head no LOC, today is having pain on right and left side of back.

## 2023-11-29 NOTE — ED Provider Notes (Signed)
 Patient signed out to myself at shift change.  Patient has been having ongoing lower back pain and hip pain for approximate the past 4 months but became worse yesterday after roller skate and falling directly onto her back and hip.  X-ray of the hip was concerning for possible fracture and CT scans were ordered. Physical Exam  BP 116/84   Pulse 68   Temp 98.1 F (36.7 C) (Oral)   Resp 18   Ht 5' (1.524 m)   Wt 58.1 kg   LMP 11/29/2023 (Approximate)   SpO2 100%   BMI 25.00 kg/m   Physical Exam  Procedures  Procedures  ED Course / MDM    Medical Decision Making Amount and/or Complexity of Data Reviewed Labs: ordered. Radiology: ordered.  Risk Prescription drug management.   Patient is doing well at this time and is stable for discharge home.  Discussed with patient that CT scans of the lumbar spine and abdomen and pelvis were unremarked for any signs of acute intra-abdominal injury or pelvic fracture.  She has no indication for vertebral fracture.  Patient is otherwise neurovascularly intact distally and is ambulating at this time under her own power without difficulty.  Will continue symptomatic treatment on outpatient basis.  Close follow-up with PCP was discussed as well as strict turn precautions for any new or worsening symptoms.  Patient voiced understanding and had no additional questions.       Daralene Lonni BIRCH, PA-C 11/29/23 2057    Franklyn Sid SAILOR, MD 11/29/23 [id number]

## 2023-11-29 NOTE — ED Provider Notes (Signed)
 [place] EMERGENCY DEPARTMENT AT [facility] Provider Note   CSN: [id number] Arrival date & time: 11/29/23  1559     Patient presents with: Back Pain  HPI Kelli Hoover is a 33 y.o. female presenting for back pain and hip pain.  She states she has had lower back pain for about 4 months.  It has been intermittent.  Yesterday she went rollerskating and landed backwards and noticed that her back pain was worse on the right and extended around her hip.  She still ambulatory and bearing weight but the pain is worse with walking.  She denies saddle anesthesia, urinary or bowel changes, IV drug use.  Denies any radiation of the pain into her abdomen.  Tried Tylenol  earlier today with minimal relief.  Also reported that she has had some mild lower abdominal pain since the fall as well.  She denies nausea vomiting diarrhea, vaginal and urinary symptoms.    Back Pain      Prior to Admission medications   Medication Sig Start Date End Date Taking? Authorizing Provider  busPIRone  (BUSPAR ) 5 MG tablet Take 1 tablet (5 mg total) by mouth 2 (two) times daily. 07/16/23   Rankin, Shuvon B, NP  cyclobenzaprine  (FLEXERIL ) 5 MG tablet Take 1 tablet (5 mg total) by mouth 3 (three) times daily as needed for muscle spasms. 03/30/23   [facility] Sebastian Pool, NP  diphenhydrAMINE  (BENADRYL ) 25 MG tablet Take 1 tablet (25 mg total) by mouth every 6 (six) hours as needed. 09/21/23   Beola Terrall RAMAN, PA-C  FLUoxetine  (PROZAC ) 20 MG capsule Take 1 capsule (20 mg total) by mouth daily. 07/16/23   Rankin, Shuvon B, NP  hydrOXYzine  (ATARAX ) 25 MG tablet Take 1 tablet (25 mg total) by mouth 3 (three) times daily as needed for anxiety. Take 10-20 mg (1-2 tablets) three times daily as needed for anxiety 07/16/23   Rankin, Shuvon B, NP  metoCLOPramide  (REGLAN ) 10 MG tablet Take 1 tablet (10 mg total) by mouth every 6 (six) hours. 09/18/23   Suellen Cantor A, PA-C  QUEtiapine  (SEROQUEL  XR) 50 MG TB24 24 hr tablet  Take 1 tablet (50 mg total) by mouth at bedtime. 07/16/23   Rankin, Shuvon B, NP    Allergies: Erythromycin    Review of Systems  Musculoskeletal:  Positive for back pain.    Updated Vital Signs BP 119/66   Pulse 65   Temp 98.1 F (36.7 C) (Oral)   Resp 18   Ht 5' (1.524 m)   Wt 58.1 kg   LMP 11/29/2023 (Approximate)   SpO2 99%   BMI 25.00 kg/m   Physical Exam Constitutional:      Appearance: Normal appearance.  HENT:     Head: Normocephalic.     Nose: Nose normal.  Eyes:     Conjunctiva/sclera: Conjunctivae normal.  Cardiovascular:     Pulses:          Dorsalis pedis pulses are 2+ on the right side and 2+ on the left side.  Pulmonary:     Effort: Pulmonary effort is normal.  Abdominal:     General: There is no distension.     Palpations: Abdomen is soft.     Tenderness: There is no abdominal tenderness.  Musculoskeletal:     Lumbar back: Tenderness present.     Comments: General tenderness with palpation of the lumbar spine especially in right lower paraspinal region.  No swelling, erythema or ecchymosis noted.  No step off.  Ambulatory steady gait.  Active range of motion of the back appears to be normal.  Some pain elicited with active flexion extension of the back.  Neurological:     Mental Status: She is alert.  Psychiatric:        Mood and Affect: Mood normal.     (all labs ordered are listed, but only abnormal results are displayed) Labs Reviewed  COMPREHENSIVE METABOLIC PANEL WITH GFR - Abnormal; Notable for the following components:      Result Value   AST 14 (*)    All other components within normal limits  CBC  ETHANOL  LACTIC ACID, PLASMA  PROTIME-INR  URINALYSIS, ROUTINE W REFLEX MICROSCOPIC  HCG, QUANTITATIVE, PREGNANCY  PREGNANCY, URINE    EKG: None  Radiology: DG Hip Unilat With Pelvis 2-3 Views Right Result Date: 11/29/2023 EXAM: 2 VIEW(S) XRAY OF THE UNILATERAL HIP 11/29/2023 04:57:00 PM COMPARISON: None available. CLINICAL  HISTORY: fall FINDINGS: BONES AND JOINTS: Question nondisplaced fracture of the inferior pubic ramus on the right at the pubic body. Consider CT for further evaluation. The hip joint is maintained. No significant degenerative changes. SOFT TISSUES: The soft tissues are unremarkable. IMPRESSION: 1. Question nondisplaced fracture of the right inferior pubic ramus at the pubic body; recommend CT for further evaluation. Electronically signed by: Norman Gatlin MD 11/29/2023 05:05 PM EST RP Workstation: [id number]     Procedures   Medications Ordered in the ED  ondansetron  (ZOFRAN ) injection 4 mg (4 mg Intravenous Given 11/29/23 1730)  sodium chloride  0.9 % bolus 1,000 mL (1,000 mLs Intravenous New Bag/Given 11/29/23 1730)  morphine  (PF) 4 MG/ML injection 4 mg (4 mg Intravenous Given 11/29/23 1730)                                    Medical Decision Making Amount and/or Complexity of Data Reviewed Labs: ordered. Radiology: ordered.  Risk Prescription drug management.   34 year old well-appearing female presenting for back and hip pain after a fall. Exam notable for tenderness in the right lower back and mild lower abdominal tenderness. DDx includes hip fracture, pelvic fracture, spinal fracture, traumatic intra-abdominal or pelvic injury.  Initial x-ray revealed questionable nondisplaced fracture of the right inferior pubic ramus. Given her lower abdominal pain after her fall yesterday with questionable pelvic fracture felt that warranted further evaluation with a CT scan of her abdomen pelvis and included her lumbar spine as well.  On reassessment, pain has improved. Anticipate discharge with Ortho follow-up if remaining workup is reassuring.  Thus far CMP, CBC and lactic acid, PT and INR all unremarkable. Urine studies still pending. Signed out patient to PA [facility].     Final diagnoses:  Closed nondisplaced fracture of pelvis, unspecified part of pelvis, initial encounter [facility])    ED  Discharge Orders     None          Lang Norleen POUR, PA-C 11/29/23 1852    Franklyn Sid SAILOR, MD 12/01/23 [phone]

## 2023-11-29 NOTE — Discharge Instructions (Signed)
 Please follow-up closely with your primary care doctor on an outpatient basis.  Return to emergency department immediately for any new or worsening symptoms.

## 2023-11-29 NOTE — ED Notes (Signed)
 Pt/family received d/c paperwork at this time. After going over the paperwork any questions, comments, or concerns were answered to the best of this nurse's knowledge. The pt/family verbally acknowledged the teachings/instructions.

## 2024-02-04 ENCOUNTER — Ambulatory Visit
Admission: EM | Admit: 2024-02-04 | Discharge: 2024-02-04 | Disposition: A | Discharge status: Home / Self Care | Attending: Family Medicine | Admitting: Family Medicine

## 2024-02-04 ENCOUNTER — Ambulatory Visit: Payer: Self-pay | Admitting: Family Medicine

## 2024-02-04 ENCOUNTER — Encounter: Payer: Self-pay | Admitting: Emergency Medicine

## 2024-02-04 ENCOUNTER — Other Ambulatory Visit: Payer: Self-pay

## 2024-02-04 ENCOUNTER — Ambulatory Visit

## 2024-02-04 DIAGNOSIS — M545 Low back pain, unspecified: Secondary | ICD-10-CM | POA: Diagnosis not present

## 2024-02-04 DIAGNOSIS — M25551 Pain in right hip: Secondary | ICD-10-CM

## 2024-02-04 MED ORDER — DEXAMETHASONE SOD PHOSPHATE PF 10 MG/ML IJ SOLN
10.0000 mg | Freq: Once | INTRAMUSCULAR | Status: AC
  Administered 2024-02-04: 10 mg via INTRAMUSCULAR

## 2024-02-04 MED ORDER — TIZANIDINE HCL 4 MG PO TABS: 4.0000 mg | ORAL_TABLET | Freq: Three times a day (TID) | ORAL | 0 refills | Status: AC | PRN

## 2024-02-04 NOTE — ED Triage Notes (Signed)
 Pt reports intermittent lower back pain/right hip pain x2 months. Pt reports has been seen in ED for similar after fall and reports last few days pain is worse and feels like skin is on fire even when I sit my hip feels out of place and weakness.

## 2024-02-04 NOTE — ED Provider Notes (Signed)
 " [facility]    CSN: [id number] Arrival date & time: 02/04/24  1637      History   Chief Complaint Chief Complaint  Patient presents with   Back Pain    HPI Kelli Hoover is a 34 y.o. female.   Patient presenting today with 23-month history of intermittent and worsening right sided low back pain and right hip pain following a fall 2 months ago.  Was seen in the emergency department after the fall and had CT L-spine and CT abdomen pelvis which were negative for acute bony abnormalities.  She states the last few days she has been having burning sensations to the right lower leg with certain movements and continues to feel like her hip feels out of place.  Trying over-the-counter remedies, massage with minimal relief.  Denies bowel or bladder incontinence, saddle anesthesias, fevers, redness or bruising.    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 Authorizing Provider  tiZANidine  (ZANAFLEX ) 4 MG tablet Take 1 tablet (4 mg total) by mouth every 8 (eight) hours as needed for muscle spasms. Do not drink alcohol or drive while taking this medication.  May cause drowsiness. 02/04/24  Yes Stuart Vernell Norris, PA-C  busPIRone  (BUSPAR ) 5 MG tablet Take 1 tablet (5 mg total) by mouth 2 (two) times daily. 07/16/23   Rankin, Shuvon B, NP  cyclobenzaprine  (FLEXERIL ) 5 MG tablet Take 1 tablet (5 mg total) by mouth 3 (three) times daily as needed for muscle spasms. 03/30/23   [facility] [place] Sebastian Pool, NP  diphenhydrAMINE  (BENADRYL ) 25 MG tablet Take 1 tablet (25 mg total) by mouth every 6 (six) hours as needed. 09/21/23   Bauer, Collin S, PA-C  FLUoxetine  (PROZAC ) 20 MG capsule Take 1 capsule (20 mg total) by mouth daily. 07/16/23   Rankin, Shuvon B, NP  hydrOXYzine  (ATARAX ) 25 MG tablet Take 1 tablet (25 mg total) by mouth 3 (three) times daily as needed for anxiety. Take 10-20 mg (1-2 tablets)  three times daily as needed for anxiety 07/16/23   Rankin, Shuvon B, NP  methocarbamol  (ROBAXIN ) 750 MG tablet Take 1 tablet (750 mg total) by mouth 3 (three) times daily. 11/29/23   Daralene Lonni BIRCH, PA-C  metoCLOPramide  (REGLAN ) 10 MG tablet Take 1 tablet (10 mg total) by mouth every 6 (six) hours. 09/18/23   Suellen Cantor A, PA-C  naproxen  (NAPROSYN ) 500 MG tablet Take 1 tablet (500 mg total) by mouth 2 (two) times daily. 11/29/23   Daralene Lonni BIRCH, PA-C  QUEtiapine  (SEROQUEL  XR) 50 MG TB24 24 hr tablet Take 1 tablet (50 mg total) by mouth at bedtime. 07/16/23   Rankin, Shuvon B, NP    Family History Family History  Problem Relation Age of Onset   Hypertension Mother    Anxiety disorder Mother    Thyroid  disease Mother    Anxiety disorder Sister    Bipolar disorder Maternal Aunt    Ulcers Paternal Aunt    Hypertension Maternal  Grandfather    Anxiety disorder Maternal Grandmother    Colon cancer Maternal Grandmother        age greater than 53   Cancer Other    Hypertension Other    Bipolar disorder Daughter     Social History Social History[1]   Allergies   Erythromycin   Review of Systems Review of Systems PER HPI  Physical Exam Triage Vital Signs ED Triage Vitals  Encounter Vitals Group     BP 02/04/24 1724 112/79     Girls Systolic BP Percentile --      Girls Diastolic BP Percentile --      Boys Systolic BP Percentile --      Boys Diastolic BP Percentile --      Pulse Rate 02/04/24 1724 79     Resp 02/04/24 1724 20     Temp 02/04/24 1724 98.2 F (36.8 C)     Temp Source 02/04/24 1724 Oral     SpO2 02/04/24 1724 96 %     Weight --      Height --      Head Circumference --      Peak Flow --      Pain Score 02/04/24 1722 9     Pain Loc --      Pain Education --      Exclude from Growth Chart --    No data found.  Updated Vital Signs BP 112/79 (BP Location: Right Arm)   Pulse 79   Temp 98.2 F (36.8 C) (Oral)   Resp 20   LMP 02/02/2024 (Approximate)   SpO2 96%   Breastfeeding No   Visual Acuity Right Eye Distance:   Left Eye Distance:   Bilateral Distance:    Right Eye Near:   Left Eye Near:    Bilateral Near:     Physical Exam Vitals and nursing note reviewed.  Constitutional:      Appearance: Normal appearance. She is not ill-appearing.  HENT:     Head: Atraumatic.  Eyes:     Extraocular Movements: Extraocular movements intact.     Conjunctiva/sclera: Conjunctivae normal.  Cardiovascular:     Rate and Rhythm: Normal rate.  Pulmonary:     Effort: Pulmonary effort is normal.  Musculoskeletal:        General: Tenderness present. No swelling. Normal range of motion.     Cervical back: Normal range of motion and neck supple.     Comments: No midline spinal tenderness to palpation diffusely.  Right lumbar musculature tender to palpation extending into right  buttock and hip.  Range of motion of the right hip fully intact.  Normal gait and range of motion.  Negative straight leg raise bilateral lower extremities.  Skin:    General: Skin is warm and dry.     Findings: No bruising or erythema.  Neurological:     Mental Status: She is alert and oriented to person, place, and time.     Comments: Bilateral lower extremities neurovascularly intact  Psychiatric:        Mood and Affect: Mood normal.        Thought Content: Thought content normal.        Judgment: Judgment normal.    UC Treatments / Results  Labs (all labs ordered are listed, but only abnormal results are displayed) Labs Reviewed - No data to display  EKG   Radiology DG Hip Unilat With Pelvis 2-3 Views Right Result Date: 02/04/2024 EXAM: 2 or 3 VIEW(S) XRAY OF THE RIGHT HIP 02/04/2024 06:00:10 PM COMPARISON: 11/29/2023 CLINICAL HISTORY: Right hip pain. FINDINGS: BONES AND JOINTS: No acute fracture. No malalignment. SOFT TISSUES: Unremarkable. IMPRESSION: 1. No acute fracture or dislocation. Electronically signed by: Rogelia Myers MD 02/04/2024 06:05 PM EST RP Workstation: [id number]    Procedures Procedures (including critical care time)  Medications Ordered in UC Medications  dexamethasone  (DECADRON ) injection 10 mg (10 mg Intramuscular Given 02/04/24 1759)    Initial Impression / Assessment and Plan / UC Course  I have reviewed the triage vital signs and the nursing notes.  Pertinent labs & imaging results that were available during my care of the patient were reviewed by me and considered in my medical decision making (see chart for details).     Vitals and exam reassuring today, x-ray of the right hip and pelvis performed per patient request though discussed that CT abdomen pelvis and CT lumbar spine performed following the fall in the emergency department were far more sensitive tests and were negative for any bony abnormality just after the incident.  This was  negative for any new bony abnormalities.  Do suspect largely muscular pain.  Trial IM Decadron , Zanaflex  and orthopedic follow-up if not resolving.  Work note given.  Final Clinical Impressions(s) / UC Diagnoses   Final diagnoses:  Right hip pain  Acute right-sided low back pain, unspecified whether sciatica present     Discharge Instructions      We have given you a steroid shot today for pain and inflammation and I have prescribed a course of muscle relaxer to be used as needed.  You may also do heat, stage, stretches and call orthopedics for follow-up if not resolving.  We will give you a call if anything comes back abnormal on your x-ray.    ED Prescriptions     Medication Sig Dispense Auth. Provider   tiZANidine  (ZANAFLEX ) 4 MG tablet Take 1 tablet (4 mg total) by mouth every 8 (eight) hours as needed for muscle spasms. Do not drink alcohol or drive while taking this medication.  May cause drowsiness. 15 tablet Stuart Vernell Norris, [place]      PDMP not reviewed this encounter.     [1]  Social History Tobacco Use   Smoking status: Former    Current packs/day: 1.00    Average packs/day: 1 pack/day for 7.0 years (7.0 ttl pk-yrs)    Types: Cigarettes   Smokeless tobacco: Never  Vaping Use   Vaping status: Every Day   Substances:  Nicotine   Substance Use Topics   Alcohol use: Yes    Comment: Occassional use   Drug use: Not Currently    Types: Marijuana, Heroin     Stuart Vernell Norris, PA-C 02/04/24 1923  "

## 2024-02-04 NOTE — Discharge Instructions (Signed)
 We have given you a steroid shot today for pain and inflammation and I have prescribed a course of muscle relaxer to be used as needed.  You may also do heat, stage, stretches and call orthopedics for follow-up if not resolving.  We will give you a call if anything comes back abnormal on your x-ray.
